# Patient Record
Sex: Female | Born: 1975
Health system: Southern US, Community
[De-identification: ages and names within clinical notes are randomized; demographics above are authoritative.]

## PROBLEM LIST (undated history)

## (undated) DIAGNOSIS — K2970 Gastritis, unspecified, without bleeding: Secondary | ICD-10-CM

## (undated) DIAGNOSIS — Z803 Family history of malignant neoplasm of breast: Secondary | ICD-10-CM

## (undated) HISTORY — PX: WISDOM TOOTH EXTRACTION: SHX21

## (undated) HISTORY — PX: KNEE SURGERY: SHX244

## (undated) HISTORY — DX: Family history of malignant neoplasm of breast: Z80.3

## (undated) HISTORY — PX: COLONOSCOPY: SHX174

---

## 1998-02-15 ENCOUNTER — Other Ambulatory Visit: Admission: RE | Admit: 1998-02-15 | Discharge: 1998-02-15 | Payer: Self-pay | Admitting: Obstetrics and Gynecology

## 1999-06-23 ENCOUNTER — Other Ambulatory Visit: Admission: RE | Admit: 1999-06-23 | Discharge: 1999-06-23 | Payer: Self-pay | Admitting: Obstetrics and Gynecology

## 2000-07-02 ENCOUNTER — Other Ambulatory Visit: Admission: RE | Admit: 2000-07-02 | Discharge: 2000-07-02 | Payer: Self-pay | Admitting: Obstetrics and Gynecology

## 2000-10-16 ENCOUNTER — Ambulatory Visit (HOSPITAL_COMMUNITY): Admission: RE | Admit: 2000-10-16 | Discharge: 2000-10-16 | Payer: Self-pay | Admitting: *Deleted

## 2000-10-16 ENCOUNTER — Encounter: Payer: Self-pay | Admitting: *Deleted

## 2000-11-09 ENCOUNTER — Inpatient Hospital Stay (HOSPITAL_COMMUNITY): Admission: AD | Admit: 2000-11-09 | Discharge: 2000-11-09 | Payer: Self-pay | Admitting: Obstetrics and Gynecology

## 2000-11-13 ENCOUNTER — Encounter: Payer: Self-pay | Admitting: *Deleted

## 2000-11-13 ENCOUNTER — Ambulatory Visit (HOSPITAL_COMMUNITY): Admission: RE | Admit: 2000-11-13 | Discharge: 2000-11-13 | Payer: Self-pay | Admitting: *Deleted

## 2000-12-04 ENCOUNTER — Encounter: Payer: Self-pay | Admitting: *Deleted

## 2000-12-04 ENCOUNTER — Ambulatory Visit (HOSPITAL_COMMUNITY): Admission: RE | Admit: 2000-12-04 | Discharge: 2000-12-04 | Payer: Self-pay | Admitting: *Deleted

## 2001-01-01 ENCOUNTER — Encounter: Payer: Self-pay | Admitting: *Deleted

## 2001-01-01 ENCOUNTER — Ambulatory Visit (HOSPITAL_COMMUNITY): Admission: RE | Admit: 2001-01-01 | Discharge: 2001-01-01 | Payer: Self-pay | Admitting: *Deleted

## 2001-02-22 ENCOUNTER — Inpatient Hospital Stay (HOSPITAL_COMMUNITY): Admission: AD | Admit: 2001-02-22 | Discharge: 2001-02-24 | Payer: Self-pay | Admitting: Obstetrics and Gynecology

## 2001-02-25 ENCOUNTER — Encounter: Admission: RE | Admit: 2001-02-25 | Discharge: 2001-03-27 | Payer: Self-pay | Admitting: Obstetrics and Gynecology

## 2002-07-23 ENCOUNTER — Other Ambulatory Visit: Admission: RE | Admit: 2002-07-23 | Discharge: 2002-07-23 | Payer: Self-pay | Admitting: Obstetrics and Gynecology

## 2002-11-20 ENCOUNTER — Other Ambulatory Visit: Admission: RE | Admit: 2002-11-20 | Discharge: 2002-11-20 | Payer: Self-pay | Admitting: Obstetrics and Gynecology

## 2003-03-01 ENCOUNTER — Inpatient Hospital Stay (HOSPITAL_COMMUNITY): Admission: AD | Admit: 2003-03-01 | Discharge: 2003-03-01 | Payer: Self-pay | Admitting: Obstetrics & Gynecology

## 2003-04-17 ENCOUNTER — Inpatient Hospital Stay (HOSPITAL_COMMUNITY): Admission: AD | Admit: 2003-04-17 | Discharge: 2003-04-17 | Payer: Self-pay | Admitting: Obstetrics & Gynecology

## 2003-05-02 ENCOUNTER — Inpatient Hospital Stay (HOSPITAL_COMMUNITY): Admission: AD | Admit: 2003-05-02 | Discharge: 2003-05-04 | Payer: Self-pay | Admitting: Obstetrics and Gynecology

## 2003-05-21 ENCOUNTER — Other Ambulatory Visit: Admission: RE | Admit: 2003-05-21 | Discharge: 2003-05-21 | Payer: Self-pay | Admitting: Obstetrics and Gynecology

## 2003-08-05 ENCOUNTER — Inpatient Hospital Stay (HOSPITAL_COMMUNITY): Admission: AD | Admit: 2003-08-05 | Discharge: 2003-08-07 | Payer: Self-pay | Admitting: Obstetrics and Gynecology

## 2003-08-08 ENCOUNTER — Encounter: Admission: RE | Admit: 2003-08-08 | Discharge: 2003-09-07 | Payer: Self-pay | Admitting: Obstetrics and Gynecology

## 2003-09-03 ENCOUNTER — Other Ambulatory Visit: Admission: RE | Admit: 2003-09-03 | Discharge: 2003-09-03 | Payer: Self-pay | Admitting: *Deleted

## 2003-09-08 ENCOUNTER — Encounter: Admission: RE | Admit: 2003-09-08 | Discharge: 2003-10-08 | Payer: Self-pay | Admitting: Obstetrics and Gynecology

## 2003-11-08 ENCOUNTER — Encounter: Admission: RE | Admit: 2003-11-08 | Discharge: 2003-12-08 | Payer: Self-pay | Admitting: Obstetrics and Gynecology

## 2004-01-08 ENCOUNTER — Encounter: Admission: RE | Admit: 2004-01-08 | Discharge: 2004-02-07 | Payer: Self-pay | Admitting: Obstetrics and Gynecology

## 2004-02-08 ENCOUNTER — Encounter: Admission: RE | Admit: 2004-02-08 | Discharge: 2004-03-09 | Payer: Self-pay | Admitting: Obstetrics and Gynecology

## 2004-04-09 ENCOUNTER — Encounter: Admission: RE | Admit: 2004-04-09 | Discharge: 2004-05-09 | Payer: Self-pay | Admitting: Obstetrics and Gynecology

## 2004-06-08 ENCOUNTER — Other Ambulatory Visit: Admission: RE | Admit: 2004-06-08 | Discharge: 2004-06-08 | Payer: Self-pay | Admitting: Obstetrics and Gynecology

## 2004-06-09 ENCOUNTER — Encounter: Admission: RE | Admit: 2004-06-09 | Discharge: 2004-06-27 | Payer: Self-pay | Admitting: Obstetrics and Gynecology

## 2005-01-14 ENCOUNTER — Inpatient Hospital Stay (HOSPITAL_COMMUNITY): Admission: AD | Admit: 2005-01-14 | Discharge: 2005-01-16 | Payer: Self-pay | Admitting: Obstetrics and Gynecology

## 2005-01-17 ENCOUNTER — Encounter: Admission: RE | Admit: 2005-01-17 | Discharge: 2005-02-16 | Payer: Self-pay | Admitting: Obstetrics and Gynecology

## 2005-02-17 ENCOUNTER — Encounter: Admission: RE | Admit: 2005-02-17 | Discharge: 2005-03-18 | Payer: Self-pay | Admitting: Obstetrics and Gynecology

## 2005-03-19 ENCOUNTER — Encounter: Admission: RE | Admit: 2005-03-19 | Discharge: 2005-04-18 | Payer: Self-pay | Admitting: Obstetrics and Gynecology

## 2005-04-19 ENCOUNTER — Encounter: Admission: RE | Admit: 2005-04-19 | Discharge: 2005-05-18 | Payer: Self-pay | Admitting: Obstetrics and Gynecology

## 2005-05-19 ENCOUNTER — Encounter: Admission: RE | Admit: 2005-05-19 | Discharge: 2005-06-18 | Payer: Self-pay | Admitting: Obstetrics and Gynecology

## 2005-06-19 ENCOUNTER — Encounter: Admission: RE | Admit: 2005-06-19 | Discharge: 2005-07-19 | Payer: Self-pay | Admitting: Obstetrics and Gynecology

## 2005-07-20 ENCOUNTER — Encounter: Admission: RE | Admit: 2005-07-20 | Discharge: 2005-08-19 | Payer: Self-pay | Admitting: Obstetrics and Gynecology

## 2005-08-20 ENCOUNTER — Encounter: Admission: RE | Admit: 2005-08-20 | Discharge: 2005-09-18 | Payer: Self-pay | Admitting: Obstetrics and Gynecology

## 2005-09-19 ENCOUNTER — Encounter: Admission: RE | Admit: 2005-09-19 | Discharge: 2005-10-19 | Payer: Self-pay | Admitting: Obstetrics and Gynecology

## 2005-10-20 ENCOUNTER — Encounter: Admission: RE | Admit: 2005-10-20 | Discharge: 2005-11-18 | Payer: Self-pay | Admitting: Obstetrics and Gynecology

## 2005-11-19 ENCOUNTER — Encounter: Admission: RE | Admit: 2005-11-19 | Discharge: 2005-12-14 | Payer: Self-pay | Admitting: Obstetrics and Gynecology

## 2007-03-03 ENCOUNTER — Inpatient Hospital Stay (HOSPITAL_COMMUNITY): Admission: AD | Admit: 2007-03-03 | Discharge: 2007-03-06 | Payer: Self-pay | Admitting: Obstetrics and Gynecology

## 2007-03-06 ENCOUNTER — Inpatient Hospital Stay (HOSPITAL_COMMUNITY): Admission: AD | Admit: 2007-03-06 | Discharge: 2007-03-07 | Payer: Self-pay | Admitting: Obstetrics & Gynecology

## 2007-04-07 ENCOUNTER — Inpatient Hospital Stay (HOSPITAL_COMMUNITY): Admission: AD | Admit: 2007-04-07 | Discharge: 2007-04-09 | Payer: Self-pay | Admitting: Obstetrics and Gynecology

## 2007-04-10 ENCOUNTER — Encounter: Admission: RE | Admit: 2007-04-10 | Discharge: 2007-05-09 | Payer: Self-pay | Admitting: Obstetrics and Gynecology

## 2007-05-10 ENCOUNTER — Encounter: Admission: RE | Admit: 2007-05-10 | Discharge: 2007-06-09 | Payer: Self-pay | Admitting: Obstetrics and Gynecology

## 2007-06-10 ENCOUNTER — Encounter: Admission: RE | Admit: 2007-06-10 | Discharge: 2007-07-10 | Payer: Self-pay | Admitting: Obstetrics and Gynecology

## 2007-07-11 ENCOUNTER — Encounter: Admission: RE | Admit: 2007-07-11 | Discharge: 2007-08-07 | Payer: Self-pay | Admitting: Obstetrics and Gynecology

## 2007-08-08 ENCOUNTER — Encounter: Admission: RE | Admit: 2007-08-08 | Discharge: 2007-09-07 | Payer: Self-pay | Admitting: Obstetrics and Gynecology

## 2007-09-08 ENCOUNTER — Encounter: Admission: RE | Admit: 2007-09-08 | Discharge: 2007-10-07 | Payer: Self-pay | Admitting: Obstetrics and Gynecology

## 2007-10-08 ENCOUNTER — Encounter: Admission: RE | Admit: 2007-10-08 | Discharge: 2007-11-07 | Payer: Self-pay | Admitting: Obstetrics and Gynecology

## 2007-11-08 ENCOUNTER — Encounter: Admission: RE | Admit: 2007-11-08 | Discharge: 2007-12-07 | Payer: Self-pay | Admitting: Obstetrics and Gynecology

## 2007-12-08 ENCOUNTER — Encounter: Admission: RE | Admit: 2007-12-08 | Discharge: 2008-01-07 | Payer: Self-pay | Admitting: Obstetrics and Gynecology

## 2008-01-08 ENCOUNTER — Encounter: Admission: RE | Admit: 2008-01-08 | Discharge: 2008-02-07 | Payer: Self-pay | Admitting: Obstetrics and Gynecology

## 2008-02-08 ENCOUNTER — Encounter: Admission: RE | Admit: 2008-02-08 | Discharge: 2008-03-08 | Payer: Self-pay | Admitting: Obstetrics and Gynecology

## 2008-03-09 ENCOUNTER — Encounter: Admission: RE | Admit: 2008-03-09 | Discharge: 2008-04-08 | Payer: Self-pay | Admitting: Obstetrics and Gynecology

## 2008-04-09 ENCOUNTER — Encounter: Admission: RE | Admit: 2008-04-09 | Discharge: 2008-05-08 | Payer: Self-pay | Admitting: Obstetrics and Gynecology

## 2008-05-09 ENCOUNTER — Encounter: Admission: RE | Admit: 2008-05-09 | Discharge: 2008-05-19 | Payer: Self-pay | Admitting: Obstetrics and Gynecology

## 2010-10-04 NOTE — Discharge Summary (Signed)
Nicole Dixon, Nicole Dixon           ACCOUNT NO.:  [id number]   MEDICAL RECORD NO.:  [id number]          PATIENT TYPE:  INP   LOCATION:  9117                          FACILITY:  [facility]   PHYSICIAN:  Gerrit Friends. Aldona Bar, M.D.   DATE OF BIRTH:  1975/09/09   DATE OF ADMISSION:  04/07/2007  DATE OF DISCHARGE:  04/09/2007                               DISCHARGE SUMMARY   DISCHARGE DIAGNOSES:  1. Term pregnancy, delivered 8 pounds 10 ounces female infant, Apgars 8      and 9.  2. Blood type O positive.   PROCEDURES:  1. Induction of labor.  2. Normal spontaneous delivery.   SUMMARY:  This 35 year old gravida 4, para 3 with a due date of November  28 was admitted at 33 to 44 weeks' gestation for induction because of  history of rapid labors after having an uncomplicated prenatal course.   After admission, her membranes were ruptured, and she progressed rapidly  to full dilatation and thereafter had delivery of a viable female infant  which weighed 8 pounds 10 ounces over an intact perineum.  Postpartum  course was benign.  Her discharge hemoglobin was 11.2 with a white count  of 10,000 and a platelet count of 162,000.  On the morning of November  18, she was ambulating well, tolerating a regular diet well, having  normal bowel and bladder function, was afebrile.  Her breast-feeding was  going well, and she desires to be discharged.   DISCHARGE MEDICATIONS:  Include vitamins - one a day as long as she is  breast-feeding, and she will use over-the-counter Motrin as needed for  discomfort.   FOLLOW-UP:  In the office will be arranged 4 weeks hence.   CONDITION ON DISCHARGE:  Improved.      Gerrit Friends. Aldona Bar, M.D.  Electronically Signed     RMW/MEDQ  D:  04/09/2007  T:  04/09/2007  Job:  [id number]

## 2010-10-04 NOTE — Discharge Summary (Signed)
NAMEKORTNEE, BAS           ACCOUNT NO.:  [id number]   MEDICAL RECORD NO.:  [id number]          PATIENT TYPE:  INP   LOCATION:  9153                          FACILITY:  [facility]   PHYSICIAN:  Ilda Mori, M.D.   DATE OF BIRTH:  1975-10-21   DATE OF ADMISSION:  03/03/2007  DATE OF DISCHARGE:  03/06/2007                               DISCHARGE SUMMARY   PREOPERATIVE DIAGNOSIS:  Preterm labor.   SECONDARY DIAGNOSIS:  Intrauterine pregnancy at [redacted] weeks gestation.   PROCEDURES:  1. Tocolysis.  2. Steroid protocol for fetal lung maturity.   COMPLICATIONS:  None.   CONDITION ON DISCHARGE:  Stable.   This is a 35 year old gravida 4, para 61 female who presented at 33.5  weeks with uterine contractions.  Cervix dilated 1 cm, and 70% effaced.  The patient had three previous full-term pregnancies.  The patient was  observed, and her contractions persisted, and she was placed on  magnesium sulfate for tocolysis, with good success.  She was also given  a dexamethasone protocol for fetal lung maturity, 6 mg q.12 h. x4.  The  patient's hospital course was uncomplicated.  On the evening of March 05, 2007, an ultrasound was done at the bedside which showed a cervical  length of 3 cm.  Fetal fibronectin was done and came back negative.  In  view of the negative fetal fibronectin and the patient's resolution of  contractions, it was felt reasonable to send her home on the morning of  the third hospital day.  She was sent home on a regular diet. Told to  limit her activities.  She was given Procardia 10 mg to take three times  a day, and to return the office in 1 week for follow-up evaluation.   LABORATORY DATA:  An ultrasound was done on March 03, 2007, which  showed an amniotic fluid index of 13.5, estimated fetal weight of 5  pounds 5 ounces, and normal anatomy.  The cervix length transabdominally  was 2.8 cm. Fetal fibronectin, as noted earlier, came back as negative.  Her admission  hemoglobin was 12.2, white count was 12,000.  Her Chem-7  was normal. LFTs were normal.  RPR was nonreactive.  She had a chest x-  ray because of chest tightness and shortness of breath, and this came  back normal.      Ilda Mori, M.D.  Electronically Signed     RK/MEDQ  D:  03/06/2007  T:  03/07/2007  Job:  [id number]

## 2011-02-28 LAB — CBC
HCT: 37.2
Hemoglobin: 11.2 — ABNORMAL LOW
Hemoglobin: 13
MCHC: 34.6
MCHC: 35
MCV: 88.7
MCV: 89.8
Platelets: 186
RBC: 3.61 — ABNORMAL LOW
RBC: 4.19
RDW: 13.9
RDW: 14
WBC: 9.5

## 2011-03-02 LAB — COMPREHENSIVE METABOLIC PANEL
ALT: 13
AST: 22
Albumin: 2.6 — ABNORMAL LOW
CO2: 23
Calcium: 8.2 — ABNORMAL LOW
Creatinine, Ser: 0.64
GFR calc Af Amer: >60
Sodium: 139

## 2011-03-02 LAB — CBC
MCHC: 35
MCV: 89.4
Platelets: 180
RBC: 3.91
WBC: 12 — ABNORMAL HIGH

## 2014-03-11 ENCOUNTER — Ambulatory Visit (INDEPENDENT_AMBULATORY_CARE_PROVIDER_SITE_OTHER): Payer: 59 | Admitting: Family Medicine

## 2014-03-11 ENCOUNTER — Other Ambulatory Visit (INDEPENDENT_AMBULATORY_CARE_PROVIDER_SITE_OTHER): Payer: 59

## 2014-03-11 VITALS — BP 106/72 | HR 75 | Ht 65.0 in | Wt 110.0 lb

## 2014-03-11 DIAGNOSIS — G5701 Lesion of sciatic nerve, right lower limb: Secondary | ICD-10-CM

## 2014-03-11 DIAGNOSIS — M25571 Pain in right ankle and joints of right foot: Secondary | ICD-10-CM

## 2014-03-11 DIAGNOSIS — M76899 Other specified enthesopathies of unspecified lower limb, excluding foot: Secondary | ICD-10-CM

## 2014-03-11 DIAGNOSIS — M76829 Posterior tibial tendinitis, unspecified leg: Secondary | ICD-10-CM

## 2014-03-11 NOTE — Patient Instructions (Signed)
Nice to see you Nicole Dixon 20 minutes 2 times daily. Usually after activity and before bed. Exercises 3 times a week. For feet and for hip alternate them.  Exercises on wall.  Heel and butt touching.  Raise leg 6 inches and hold 2 seconds.  Down slow for count of 4 seconds.  1 set of 30 reps daily on both sides.  On step heel lifts. 30 reps daily first week, then 2 sets daily second week then Everest daily 3 rd week.  Vitamin D 2000 IU daily.  [place] back right pocket with sitting.  Line drills when running focusing on keeping big toes straight. Come back in 3 weeks.

## 2014-03-11 NOTE — Progress Notes (Signed)
[name] [place], [place] [address] Phone: [phone] Subjective:    CC: Leg pain.   UUV:OZDGUYQIHK Nicole Dixon is a 38 y.o. female coming in with complaint of left leg pain and right hip pain. Patient also states that she is having bilateral ankle pain.  Patient is the most concerning is the left leg pain. Patient has seen another provider for this previously. Patient states that she has been diagnosed with iliotibial band syndrome multiple times. Patient is an avid runner and states when she is training for a marathon she starts having discomfort in the lateral aspect of her leg. Patient did have a popping sensation that can be very painful. Denies any radiation of pain or any numbness. Patient states that she is done formal physical therapy as well as exercises with minimal improvement. Patient is wondering why this continues to recur and if she can avoid this. Pain is 5/10. Denies any nighttime awakening.  Patient also states that she is having some mild right hip pain. States it hurts more when she sits for a long amount of time and then tries to run. Patient states it is more of a spasm and sensation. Denies any radiation down her leg and states that it seems localized. Patient has been diagnosed previously with a piriformis syndrome.   Bilateral ankle pains. Patient states it hurts on the medial aspect of the ankles. Very minimal pain, does not stop her from activity, worse with longer runs, no numbness, no radiation. Improves with icing and ibuprofen. 3/10 severity.       Past medical history, social, surgical and family history all reviewed in electronic medical record.   Review of Systems: No headache, visual changes, nausea, vomiting, diarrhea, constipation, dizziness, abdominal pain, skin rash, fevers, chills, night sweats, weight loss, swollen lymph nodes, body aches, joint swelling, muscle aches, chest pain, shortness of  breath, mood changes.   Objective Blood pressure 106/72, pulse 75, height 5\' 5"  (1.651 m), weight 110 lb (49.896 kg), SpO2 99.00%.  General: No apparent distress alert and oriented x3 mood and affect normal, dressed appropriately.  HEENT: Pupils equal, extraocular movements intact  Respiratory: Patient's speak in full sentences and does not appear short of breath  Cardiovascular: No lower extremity edema, non tender, no erythema  Skin: Warm dry intact with no signs of infection or rash on extremities or on axial skeleton.  Abdomen: Soft nontender  Neuro: Cranial nerves II through XII are intact, neurovascularly intact in all extremities with 2+ DTRs and 2+ pulses.  Lymph: No lymphadenopathy of posterior or anterior cervical chain or axillae bilaterally.  Gait normal with good balance and coordination.  Patient's running gait though shows the patient's knees to cross midline significantly and does have overpronation of the hindfoot. Patient is a heelstriker MSK:  Non tender with full range of motion and good stability and symmetric strength and tone of shoulders, elbows, wrist,  Knees bilaterally.  Hip: Right ROM IR: 25 Deg, ER: 45 Deg, Flexion: 120 Deg, Extension: 100 Deg, Abduction: 45 Deg, Adduction: 25 Deg Strength IR: 5/5, ER: 5/5, Flexion: 5/5, Extension: 5/5, Abduction: 3+/5, Adduction: 5/5 Pelvic alignment unremarkable to inspection and palpation. Standing hip rotation and gait without trendelenburg sign / unsteadiness. Greater trochanter without tenderness to palpation. Moderate tenderness on the right piriformis. + FABER TTP over distal ITB bilateral No SI joint tenderness and normal minimal SI movement. Ankle: bilateral No visible erythema or swelling. Range of motion is  full in all directions. Over-pronation hindfoot bilaterally. Strength is 5/5 in all directions. Stable lateral and medial ligaments; squeeze test and kleiger test unremarkable; Talar dome nontender; No pain  at base of 5th MT; No tenderness over cuboid; No tenderness over N spot or navicular prominence No tenderness on posterior aspects of lateral and medial malleolus mild pain over posterior tib.  No sign of peroneal tendon subluxations or tenderness to palpation Negative tarsal tunnel tinel's Able to walk 4 steps.   MSK US performed of: Bilateral This study was ordered, performed, and interpreted by Charlann Boxer D.O.  Foot/Ankle:   All structures visualized.   Talar dome unremarkable  Ankle mortise without effusion. Peroneus longus and brevis tendons unremarkable on long and transverse views without sheath effusions. Posterior tibialis has mild hypoechoic changes but no true tear appreciated, flexor hallucis longus, and flexor digitorum longus tendons unremarkable on long and transverse views without sheath effusions. Achilles tendon visualized along length of tendon and unremarkable on long and transverse views without sheath effusion. Anterior Talofibular Ligament and Calcaneofibular Ligaments unremarkable and intact. Deltoid Ligament unremarkable and intact. Plantar fascia intact and without effusion, normal thickness. No increased doppler signal, cap sign, or thickening of tibial cortex. Power doppler signal normal.  IMPRESSION:  Mild bilateral posterior tibialis    Impression and Recommendations:     This case required medical decision making of moderate complexity.

## 2014-03-12 NOTE — Assessment & Plan Note (Signed)
Patient's pain with running as well as her distal iliotibial band syndrome secondary to the hip abductor weakness. We discussed some exercises for strengthening and which activities to avoid any interim. Patient and will come back and see [place] again in 3 weeks to make sure that she improves.

## 2014-03-12 NOTE — Assessment & Plan Note (Signed)
Piriformis Syndrome  Using an anatomical model, reviewed with the patient the structures involved and how they related to diagnosis. The patient indicated understanding.   The patient was given a handout from Dr. Rouzier's book "The Sports Medicine Patient Advisor" describing the anatomy and rehabilitation of the following condition: Piriformis Syndrome  Also given a handout with more extensive Piriformis stretching, hip flexor and abductor strengthening, ham stretching  Rec deep massage, explained self-massage with ball RTC in 3 weeks.  

## 2014-03-12 NOTE — Assessment & Plan Note (Signed)
The patient has mild posterior tibialis tendinitis secondary to her overpronation. We discussed home exercises, proper shoe wear, over-the-counter orthotics, and icing protocol. Patient will try this as well as a trial of topical anti-inflammatories and come back and see me again

## 2014-04-01 ENCOUNTER — Ambulatory Visit: Payer: 59 | Admitting: Family Medicine

## 2014-04-08 ENCOUNTER — Encounter: Payer: Self-pay | Admitting: Family Medicine

## 2014-04-08 ENCOUNTER — Ambulatory Visit (INDEPENDENT_AMBULATORY_CARE_PROVIDER_SITE_OTHER): Payer: 59 | Admitting: Family Medicine

## 2014-04-08 VITALS — BP 122/80 | HR 75 | Ht 65.0 in | Wt 110.0 lb

## 2014-04-08 DIAGNOSIS — M76829 Posterior tibial tendinitis, unspecified leg: Secondary | ICD-10-CM

## 2014-04-08 DIAGNOSIS — M76899 Other specified enthesopathies of unspecified lower limb, excluding foot: Secondary | ICD-10-CM

## 2014-04-08 NOTE — Assessment & Plan Note (Signed)
Patient is improving significantly. Discussed with patient to continue the home exercises fairly regularly. Patient will continue with the line drills with running. We discussed other strengthening exercises including Achilles strengthening as well as strengthening that will be beneficial. We discussed the possibility of compression sleeve with running. Patient is to follow-up with me again in 4 weeks. Patient will start on the exercise prescription as stated in the patient instructions.

## 2014-04-08 NOTE — Assessment & Plan Note (Signed)
Continue with the strengthening exercises. Patient is going to start with more of a weight training aspect of it as well to try to do more strengthening. We discussed if any worsening pain and come back sooner but otherwise will follow-up in 4 weeks.

## 2014-04-08 NOTE — Patient Instructions (Signed)
Good to see you I am impressed.  On wall exercise add 2# ankle weights.  Start running 5:1 minutes running to walking 3 times a week. 1.5 miles top.  Then option is to either increase by 1/2 mile a week up to 3 miles or increase amount of running per 1 minute walking.  See me again in 4 weeks.

## 2014-04-08 NOTE — Progress Notes (Signed)
[name] [address] [place], [place] [address] Phone: [phone] Subjective:    CC: Leg pain.   WNU:UVOZDGUYQI Nicole Dixon is a 38 y.o. female coming in with complaint of left leg pain and right hip pain. Patient also states that she is having bilateral ankle pain. Patient was seen previously and did have posterior tibialis tendinitis. Patient was given exercises and states that she is approximately 80% better. Patient has started running slowly and has started with some of the line drills. Patient states that she has noticed significant improvement. Patient is even improved her time with running.  Patient is working on the hip abductor weakness that was noted as well. Patient has noticed that this has made the biggest differences. Patient is able to go up and down stairs much more efficiently she states. Denies the radiation to the lateral aspect of the knees. Patient is feeling better overall.     Past medical history, social, surgical and family history all reviewed in electronic medical record.   Review of Systems: No headache, visual changes, nausea, vomiting, diarrhea, constipation, dizziness, abdominal pain, skin rash, fevers, chills, night sweats, weight loss, swollen lymph nodes, body aches, joint swelling, muscle aches, chest pain, shortness of breath, mood changes.   Objective Blood pressure 122/80, pulse 75, height 5\' 5"  (1.651 m), weight 110 lb (49.896 kg), SpO2 99 %.  General: No apparent distress alert and oriented x3 mood and affect normal, dressed appropriately.  HEENT: Pupils equal, extraocular movements intact  Respiratory: Patient's speak in full sentences and does not appear short of breath  Cardiovascular: No lower extremity edema, non tender, no erythema  Skin: Warm dry intact with no signs of infection or rash on extremities or on axial skeleton.  Abdomen: Soft nontender  Neuro: Cranial nerves II through XII are intact,  neurovascularly intact in all extremities with 2+ DTRs and 2+ pulses.  Lymph: No lymphadenopathy of posterior or anterior cervical chain or axillae bilaterally.  Gait normal with good balance and coordination.  Patient's running gait though shows the patient's knees to cross midline significantly and does have overpronation of the hindfoot. Patient is a heelstriker MSK:  Non tender with full range of motion and good stability and symmetric strength and tone of shoulders, elbows, wrist,  Knees bilaterally.  Hip: Right ROM IR: 25 Deg, ER: 45 Deg, Flexion: 120 Deg, Extension: 100 Deg, Abduction: 45 Deg, Adduction: 25 Deg Strength IR: 5/5, ER: 5/5, Flexion: 5/5, Extension: 5/5, Abduction: 3+/5, Adduction: 5/5 Pelvic alignment unremarkable to inspection and palpation. Standing hip rotation and gait without trendelenburg sign / unsteadiness. Greater trochanter without tenderness to palpation. Improve tenderness over the right piriformis + FABERbut improved range of motion nontender over the distal IT band bilaterally this is different from previous exam No SI joint tenderness and normal minimal SI movement. Ankle: bilateral No visible erythema or swelling. Range of motion is full in all directions. Over-pronation hindfoot bilaterally. Strength is 5/5 in all directions. Stable lateral and medial ligaments; squeeze test and kleiger test unremarkable; Talar dome nontender; No pain at base of 5th MT; No tenderness over cuboid; No tenderness over N spot or navicular prominence No tenderness on posterior aspects of lateral and medial malleolus mild pain over posterior tib.  No sign of peroneal tendon subluxations or tenderness to palpation Negative tarsal tunnel tinel's Able to walk 4 steps.  no significant change of the ankles from previous exam.     Impression and Recommendations:  This case required medical decision making of moderate complexity.

## 2014-05-06 ENCOUNTER — Encounter: Payer: Self-pay | Admitting: Family Medicine

## 2014-05-06 ENCOUNTER — Ambulatory Visit (INDEPENDENT_AMBULATORY_CARE_PROVIDER_SITE_OTHER): Payer: 59 | Admitting: Family Medicine

## 2014-05-06 VITALS — BP 110/60 | HR 81 | Ht 65.0 in | Wt 113.0 lb

## 2014-05-06 DIAGNOSIS — M7052 Other bursitis of knee, left knee: Secondary | ICD-10-CM | POA: Insufficient documentation

## 2014-05-06 NOTE — Progress Notes (Signed)
[facility] [place], [place] [address] Phone: [phone] Subjective:    CC: Leg pain.   GMW:NUUVOZDGUY Nicole Dixon is a 38 y.o. female coming in with complaint of left leg pain.   Patient also states that she is having bilateral ankle pain. Patient was seen previously and did have posterior tibialis tendinitis. 99% better no pain stopping her from activity.   Patient is working on the hip abductor weakness that was noted as well. Patient was having pain now more at the lower insertion of her iliotibial band. Patient states that it has been sore previously and has noticed that this is more after running. Patient states that she runs downhill it seems to be significantly more painful. Patient states though not painful enough to stop her from any activity. States that the lateral aspect of her hip is feeling significant better though.     Past medical history, social, surgical and family history all reviewed in electronic medical record.   Review of Systems: No headache, visual changes, nausea, vomiting, diarrhea, constipation, dizziness, abdominal pain, skin rash, fevers, chills, night sweats, weight loss, swollen lymph nodes, body aches, joint swelling, muscle aches, chest pain, shortness of breath, mood changes.   Objective Blood pressure 110/60, pulse 81, height 5\' 5"  (1.651 m), weight 113 lb (51.256 kg), SpO2 97 %.  General: No apparent distress alert and oriented x3 mood and affect normal, dressed appropriately.  HEENT: Pupils equal, extraocular movements intact  Respiratory: Patient's speak in full sentences and does not appear short of breath  Cardiovascular: No lower extremity edema, non tender, no erythema  Skin: Warm dry intact with no signs of infection or rash on extremities or on axial skeleton.  Abdomen: Soft nontender  Neuro: Cranial nerves II through XII are intact, neurovascularly intact in all extremities with 2+ DTRs  and 2+ pulses.  Lymph: No lymphadenopathy of posterior or anterior cervical chain or axillae bilaterally.  Gait normal with good balance and coordination.  Patient's running gait though shows the patient's knees to cross midline significantly and does have overpronation of the hindfoot. Patient is a heelstriker MSK:  Non tender with full range of motion and good stability and symmetric strength and tone of shoulders, elbows, wrist,  bilaterally.  Hip: Right ROM IR: 25 Deg, ER: 45 Deg, Flexion: 120 Deg, Extension: 100 Deg, Abduction: 45 Deg, Adduction: 25 Deg Strength IR: 5/5, ER: 5/5, Flexion: 5/5, Extension: 5/5, Abduction: 3+/5, Adduction: 5/5 Pelvic alignment unremarkable to inspection and palpation. Standing hip rotation and gait without trendelenburg sign / unsteadiness. Greater trochanter without tenderness to palpation. Improve tenderness over the right piriformis Negative Faber nontender over the distal IT band bilaterally this is different from previous exam No SI joint tenderness and normal minimal SI movement.  Knee:left Normal to inspection with no erythema or effusion or obvious bony abnormalities. Patient is tender to palpation over the lateral joint line. ROM full in flexion and extension and lower leg rotation. Ligaments with solid consistent endpoints including ACL, PCL, LCL, MCL. Negative Mcmurray's, Apley's, and Thessalonian tests. Non painful patellar compression. Patellar glide without crepitus. Patellar and quadriceps tendons unremarkable. Hamstring and quadriceps strength is normal.   MSK US performed of: left knee This study was ordered, performed, and interpreted by Charlann Boxer D.O.  Knee: All structures visualized. Anteromedial, anterolateral, posteromedial, and posterolateral menisci unremarkable without tearing, fraying, effusion, or displacement. Popliteus tendon does have hypoechoic changes but no true tear patient. Patellar Tendon unremarkable  on long  and transverse views without effusion. No abnormality of prepatellar bursa. LCL and MCL unremarkable on long and transverse views. No abnormality of origin of medial or lateral head of the gastrocnemius.  IMPRESSION:  Popliteus tendinitis     Impression and Recommendations:     This case required medical decision making of moderate complexity.

## 2014-05-06 NOTE — Patient Instructions (Signed)
Good to see you [email or web address] size small knee sleeve.  Ice after activity.  Try the pennsaid up to 2 times daily  New exercises after running You are getting so strong!!! Protein!!!! 4:1 ratio of carbs to protein within 30 minutes of exercise.  See me in 4 weeks.  Popliteus Tendinitis with Rehab Tendonitis is a condition that is characterized by inflammation of a tendon. A tendon is the soft tissue that connects muscles to the skeletal system allowing for body movements. Popliteus tendonitis affects the popliteus tendon, which connects the popliteus muscle to the thigh bone (femur) near the knee. The popliteus muscle helps bend and rotate the knee. Popliteus tendonitis is often caused by a tendon tear (strain). Strains are classified into three categories. Grade 1 strains cause pain, but the tendon is not lengthened. Grade 2 strains include a lengthened ligament due to the ligament being stretched or partially ruptured. With grade 2 strains there is still function, although the function may be diminished. Grade 3 strains are characterized by a complete tear of the tendon or muscle, and function is usually impaired. SYMPTOMS   Pain in the knee, specifically the outer (lateral) and back (posterior) portions.  Pain that worsens with use of the popliteus muscle (standing on a slightly bent knee or rotating the knee).  A crackling sound (crepitation) when the tendon is moved or touched (uncommon, except when tested just after exercising). CAUSES Popliteus tendonitis occurs when damage to the popliteus tendon elicits an inflammatory (healing) response. Popliteus tendonitis is often an overuse injury. RISK INCREASES WITH:  Activities that require extensive running or walking downhill.  Poor strength and flexibility.  Failure to warm-up properly before activity.  Flat feet. PREVENTION  Warm up and stretch properly before activity.  Allow for adequate recovery between workouts.  Maintain  physical fitness:  Strength, flexibility, and endurance.  Cardiovascular fitness.  Learn and implement proper training regimens and sports technique.  Arch supports (orthotics) for individuals with flat feet. PROGNOSIS  If treated properly, then the symptoms of popliteus tendonitis usually resolve within 6 weeks.  RELATED COMPLICATIONS   Prolonged healing time, if improperly treated or re-injured.  Recurrent symptoms that result in a chronic problem. TREATMENT  Treatment initially involves the use of ice and medication to help reduce pain and inflammation. The use of strengthening and stretching exercises may help reduce pain with activity. These exercises may be performed at home or with referral to a therapist. Many individuals find that the use of a compression bandage or a knee sleeve helps reduce symptoms. If you have flat feet, then your caregiver may recommend arch supports. It is important to learn/modify techniques for running uphill/downhill that do not aggravate your symptoms. If symptoms persist for greater than 6 months despite conservative (non-surgical) treatment, then surgery may be recommended to remove the tendon sheath (lining).  MEDICATION   If pain medication is necessary, then nonsteroidal anti-inflammatory medications, such as aspirin and ibuprofen, or other minor pain relievers, such as acetaminophen, are often recommended.  Do not take pain medication within 7 days before surgery.  Prescription pain relievers may be given if deemed necessary by your caregiver. Use only as directed and only as much as you need. HEAT AND COLD  Cold treatment (icing) relieves pain and reduces inflammation. Cold treatment should be applied for 10 to 15 minutes every 2 to 3 hours for inflammation and pain and immediately after any activity that aggravates your symptoms. Use ice packs or massage the area with a  piece of ice (ice massage).  Heat treatment may be used prior to performing  the stretching and strengthening activities prescribed by your caregiver, physical therapist, or athletic trainer. Use a heat pack or soak the injury in warm water. SEEK MEDICAL CARE IF:  Treatment seems to offer no benefit, or the condition worsens.  Any medications produce adverse side effects. EXERCISES RANGE OF MOTION (ROM) AND STRETCHING EXERCISES - Popliteus Tendinitis These exercises may help you when beginning to rehabilitate your injury. Your symptoms may resolve with or without further involvement from your physician, physical therapist or athletic trainer. While completing these exercises, remember:   Restoring tissue flexibility helps normal motion to return to the joints. This allows healthier, less painful movement and activity.  An effective stretch should be held for at least 30 seconds.  A stretch should never be painful. You should only feel a gentle lengthening or release in the stretched tissue. STRETCH - Gastroc, Standing   Place hands on wall.  Extend right / left leg, keeping the front knee somewhat bent.  Slightly point your toes inward on your back foot.  Keeping your right / left heel on the floor and your knee straight, shift your weight toward the wall, not allowing your back to arch.  You should feel a gentle stretch in the right / left calf. Hold this position for __________ seconds. Repeat __________ times. Complete this stretch __________ times per day. STRETCH - Soleus, Standing   Place hands on wall.  Extend right / left leg, keeping the other knee somewhat bent.  Slightly point your toes inward on your back foot.  Keep your right / left heel on the floor, bend your back knee, and slightly shift your weight over the back leg so that you feel a gentle stretch deep in your back calf.  Hold this position for __________ seconds. Repeat __________ times. Complete this stretch __________ times per day. STRETCH - Gastrocsoleus, Standing  Note: This  exercise can place a lot of stress on your foot and ankle. Please complete this exercise only if specifically instructed by your caregiver.   Place the ball of your right / left foot on a step, keeping your other foot firmly on the same step.  Hold on to the wall or a rail for balance.  Slowly lift your other foot, allowing your body weight to press your heel down over the edge of the step.  You should feel a stretch in your right / left calf.  Hold this position for __________ seconds.  Repeat this exercise with a slight bend in your right / left knee. Repeat __________ times. Complete this stretch __________ times per day.  STRETCH - Hamstrings, Standing  Stand or sit and extend your right / left leg, placing your foot on a chair or foot stool  Keeping a slight arch in your low back and your hips straight forward.  Lead with your chest and lean forward at the waist until you feel a gentle stretch in the back of your right / left knee or thigh. (When done correctly, this exercise requires leaning only a small distance.)  Hold this position for __________ seconds. Repeat __________ times. Complete this stretch __________ times per day. STRETCH - Hamstrings, Supine   Lie on your back. Loop a belt or towel over the ball of your right / left foot.  Straighten your right / left knee and slowly pull on the belt to raise your leg. Do not allow the right /  left knee to bend. Keep your opposite leg flat on the floor.  Raise the leg until you feel a gentle stretch behind your right / left knee or thigh. Hold this position for __________ seconds. Repeat __________ times. Complete this stretch __________ times per day.  STRETCH - Hamstrings, Doorway  Lie on your back with your right / left leg extended and resting on the wall and the opposite leg flat on the ground through the door. Initially, position your bottom farther away from the wall than the illustration shows.  Keep your right /  left knee straight. If you feel a stretch behind your knee or thigh, hold this position for __________ seconds.  If you do not feel a stretch, scoot your bottom closer to the door, and hold __________ seconds. Repeat __________ times. Complete this stretch __________ times per day.  STRETCH - Quadriceps, Prone   Lie on your stomach on a firm surface, such as a bed or padded floor.  Bend your right / left knee and grasp your ankle. If you are unable to reach, your ankle or pant leg, use a belt around your foot to lengthen your reach.  Gently pull your heel toward your buttocks. Your knee should not slide out to the side. You should feel a stretch in the front of your thigh and/or knee.  Hold this position for __________ seconds. Repeat __________ times. Complete this stretch __________ times per day.  STRENGTHENING EXERCISES - Popliteus Tendinitis These exercises may help you when beginning to rehabilitate your injury. They may resolve your symptoms with or without further involvement from your physician, physical therapist or athletic trainer. While completing these exercises, remember:   Muscles can gain both the endurance and the strength needed for everyday activities through controlled exercises.  Complete these exercises as instructed by your physician, physical therapist or athletic trainer. Progress the resistance and repetitions only as guided. STRENGTH - Hamstring, Isometrics   Lie on your back on a firm surface.  Bend your right / left knee approximately __________ degrees.  Dig your heel into the surface as if you are trying to pull it toward your buttocks. Tighten the muscles in the back of your thighs to "dig" as hard as you can without increasing any pain.  Hold this position for __________ seconds.  Release the tension gradually and allow your muscle to completely relax for __________ seconds in between each exercise. Repeat __________ times. Complete this exercise  __________ times per day.  STRENGTH - Hamstring, Curls   Lay on your stomach with your legs extended. (If you lay on a bed, your feet may hang over the edge.)  Tighten the muscles in the back of your thigh to bend your right / left knee up to 90 degrees. Keep your hips flat on the bed/floor.  Hold this position for __________ seconds.  Slowly lower your leg back to the starting position. Repeat __________ times. Complete this exercise __________ times per day.  OPTIONAL ANKLE WEIGHTS: Begin with ____________________, but DO NOT exceed ____________________. Increase in1 lb/0.5 kg increments. Document Released: 05/08/2005 Document Revised: 07/31/2011 Document Reviewed: 08/20/2008 [facility] Patient Information 2015 [place], [place]. This information is not intended to replace advice given to you by your health care provider. Make sure you discuss any questions you have with your health care provider.

## 2014-05-06 NOTE — Assessment & Plan Note (Signed)
Compression, sleeve, icing, HEP, and discussed topical medicines, rx given.  Patient will come back and see me again in 3 weeks.  Spent greater than 25 minutes with patient face-to-face and had greater than 50% of counseling including as described above in assessment and plan.

## 2014-06-04 ENCOUNTER — Ambulatory Visit: Payer: 59 | Admitting: Family Medicine

## 2014-06-19 ENCOUNTER — Ambulatory Visit: Payer: Self-pay | Admitting: Family Medicine

## 2014-07-07 ENCOUNTER — Ambulatory Visit: Payer: Self-pay | Admitting: Family Medicine

## 2014-10-27 ENCOUNTER — Other Ambulatory Visit: Payer: Self-pay | Admitting: Obstetrics and Gynecology

## 2014-10-27 DIAGNOSIS — Z1231 Encounter for screening mammogram for malignant neoplasm of breast: Secondary | ICD-10-CM

## 2014-10-27 DIAGNOSIS — Z803 Family history of malignant neoplasm of breast: Secondary | ICD-10-CM

## 2014-10-28 ENCOUNTER — Ambulatory Visit
Admission: RE | Admit: 2014-10-28 | Discharge: 2014-10-28 | Disposition: A | Discharge status: Home / Self Care | Payer: 59 | Source: Ambulatory Visit | Attending: Obstetrics and Gynecology | Admitting: Obstetrics and Gynecology

## 2014-10-28 ENCOUNTER — Encounter (INDEPENDENT_AMBULATORY_CARE_PROVIDER_SITE_OTHER): Payer: Self-pay

## 2014-10-28 DIAGNOSIS — Z1231 Encounter for screening mammogram for malignant neoplasm of breast: Secondary | ICD-10-CM

## 2014-10-28 DIAGNOSIS — Z803 Family history of malignant neoplasm of breast: Secondary | ICD-10-CM

## 2014-10-29 ENCOUNTER — Ambulatory Visit (HOSPITAL_BASED_OUTPATIENT_CLINIC_OR_DEPARTMENT_OTHER): Payer: 59 | Admitting: Genetic Counselor

## 2014-10-29 ENCOUNTER — Encounter: Payer: Self-pay | Admitting: Genetic Counselor

## 2014-10-29 ENCOUNTER — Other Ambulatory Visit: Payer: 59

## 2014-10-29 DIAGNOSIS — Z315 Encounter for genetic counseling: Secondary | ICD-10-CM

## 2014-10-29 DIAGNOSIS — Z803 Family history of malignant neoplasm of breast: Secondary | ICD-10-CM | POA: Diagnosis not present

## 2014-10-29 NOTE — Progress Notes (Signed)
REFERRING PROVIDER: Freda Munro, MD  PRIMARY PROVIDER:  No primary care provider on file.  PRIMARY REASON FOR VISIT:  1. Family history of breast cancer      HISTORY OF PRESENT ILLNESS:   Nicole Dixon, a 39 y.o. female, was seen for a [place] cancer genetics consultation at the request of Dr. Ouida Dixon due to a family history of cancer.  Nicole Dixon presents to clinic today to discuss the possibility of a hereditary predisposition to cancer, genetic testing, and to further clarify her future cancer risks, as well as potential cancer risks for family members. Nicole Dixon is a 39 y.o. female with no personal history of cancer.  Her twin sister was recently diagnosed with breast cancer and was found to carry a PALB2 mutation.  Nicole Dixon mother was tested for the PALB2 mutation and was found to be negative.  Additional genetic testing is pending on her mother. Based on the genetic testing in Nicole Dixon family, it appears that the mutation may be coming from her father's side of the family.  CANCER HISTORY:   No history exists.     HORMONAL RISK FACTORS:  Menarche was at age 1.  First live birth at age 72.  OCP use for approximately <1 years.  Ovaries intact: yes.  Hysterectomy: no.  Menopausal status: premenopausal.  HRT use: 0 years. Colonoscopy: no; not examined. Mammogram within the last year: yes. Number of breast biopsies: 0. Up to date with pelvic exams:  yes. Any excessive radiation exposure in the past:  no  Past Medical History  Diagnosis Date  . Family history of breast cancer     History reviewed. No pertinent past surgical history.  History   Social History  . Marital Status: Married    Spouse Name: N/A  . Number of Children: 4  . Years of Education: N/A   Social History Main Topics  . Smoking status: Never Smoker   . Smokeless tobacco: Not on file  . Alcohol Use: 0.0 oz/week    0 Standard drinks or equivalent per week  . Drug Use:  Not on file  . Sexual Activity: Not on file   Other Topics Concern  . None   Social History Narrative     FAMILY HISTORY:  We obtained a detailed, 4-generation family history.  Significant diagnoses are listed below: Family History  Problem Relation Age of Onset  . Breast cancer Sister 40    PALB2+  . Breast cancer Maternal Aunt 64  . Bladder Cancer Maternal Aunt 64  . Dementia Maternal Grandmother   . Breast cancer Paternal Grandmother     dx in her 36s  . Breast cancer Cousin     dx in her late 23s; BRCA-  . Cancer Cousin     leiomyosarcoma  . Breast cancer Other     MGMs sister  . Breast cancer Other     MGMs sister  . Cancer Other     PGFs twin brother with Cancer NOS   The patient's sister was recently diagnosed with breast cancer and has a PALB2 mutation.  She has three boys who are healthy and a daughter who passed away at age 56 from a seizure.  Nicole Dixon mother was found to be negative for the [facility] in [place].  Her mother has two brothers and a sister who had breast cancer at 81 and passed away.  This sister has three daughters, one who had breast cancer in her late 53s and is  reportedly BRCA negative.  There is another cousin who was diagnosed with a leiomyosarcoma in her 89s, and ultimately succumbed to leukemia.  Nicole Dixon maternal grandparents are deceased.  Her grandmother had two sisters with breast cancer and one of those sisters had a daughter with breast cancer.  Nicole Dixon father has one sister who is healthy.  His mother had breast cancer in her 32s, and his father's twin brother had a Cancer NOS. Patient's maternal ancestors are of Caucasian descent, and paternal ancestors are of Caucasian descent. There is no reported Ashkenazi Jewish ancestry. There is no known consanguinity.  GENETIC COUNSELING ASSESSMENT: Nicole Dixon is a 39 y.o. 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 We, therefore, discussed and recommended the following at today's visit.   DISCUSSION: We reviewed the characteristics, features and inheritance patterns of hereditary cancer syndromes. We discussed the risks for PALB2 is 50% based on her sister's diagnosis.  We also reviewed the slim possibility that her sister could be the first person in the family with a PALB2 mutation.  We reviewed PALB2 cancer risks for breast cancer and management options based on a positive test.  Nicole Dixon mother is having a larger panel test added to her original targeted PALB2 testing to confirm that there is not a separate hereditary cancer syndrome running in the family based on her family history.  We also discussed genetic testing, including the appropriate family members to test, the process of testing, insurance coverage and turn-around-time for results. We discussed the implications of a negative, positive and/or variant of uncertain significant result. We recommended Nicole Dixon pursue genetic testing for the PALB2 gene, and will put additional testing on hold until her mother's genetic testing is completed.   Based on Nicole Dixon family history of cancer, she meets medical criteria for genetic testing. Despite that she meets criteria, she may still have an out of pocket cost. We discussed that if her out of pocket cost for testing is over $100, the laboratory will call and confirm whether she wants to proceed with testing.  If the out of pocket cost of testing is less than $100 she will be billed by the genetic testing laboratory.   PLAN: After considering the risks, benefits, and limitations, Nicole Dixon  provided informed consent to pursue genetic testing and the blood sample was sent to [facility] for analysis of the PALB2 gene, and then putting her sample on hold waiting for her mother's result. Results should be available within approximately 7-10 days time,  at which point they will be disclosed by telephone to Nicole Dixon, as will any additional recommendations warranted by these results. Nicole Dixon will receive a summary of her genetic counseling visit and a copy of her results once available. This information will also be available in Epic. We encouraged Nicole Dixon to remain in contact with cancer genetics annually so that we can continuously update the family history and inform her of any changes in cancer genetics and testing that may be of benefit for her family. Nicole Dixon. Klugh's questions were answered to her satisfaction today. Our contact information was provided should additional questions or concerns arise.  Lastly, we encouraged Nicole Dixon. Darsey to remain in contact with cancer genetics annually so that we can continuously update the family history and inform her of any changes in cancer genetics and testing that may be of benefit for  this family.   Nicole Dixon.  Alverio's questions were answered to her satisfaction today. Our contact information was provided should additional questions or concerns arise. Thank you for the referral and allowing [place] to share in the care of your patient.   Shaguana Love P. Florene Glen, [place], [facility] Certified Genetic Counselor [email or web address] phone: [phone]  The patient was seen for a total of 30 minutes in face-to-face genetic counseling.  This patient was discussed with Drs. Magrinat, Lindi Adie and/or Burr Medico who agrees with the above.    _______________________________________________________________________ For Office Staff:  Number of people involved in session: 1 Was an Intern/ student involved with case: no

## 2014-11-19 ENCOUNTER — Telehealth: Payer: Self-pay | Admitting: Genetic Counselor

## 2014-11-19 NOTE — Telephone Encounter (Signed)
Informed Ms. Chandran that her mother's genetic test results were negative, so we will go ahead and proceed with single-site genetic testing as planned.  Results should be available in about 2 weeks, and either myself or Santiago Glad will call her with those.

## 2014-11-27 ENCOUNTER — Encounter: Payer: Self-pay | Admitting: Genetic Counselor

## 2014-11-27 ENCOUNTER — Telehealth: Payer: Self-pay | Admitting: Genetic Counselor

## 2014-11-27 DIAGNOSIS — Z1379 Encounter for other screening for genetic and chromosomal anomalies: Secondary | ICD-10-CM

## 2014-11-27 DIAGNOSIS — Z803 Family history of malignant neoplasm of breast: Secondary | ICD-10-CM

## 2014-11-27 NOTE — Progress Notes (Signed)
GENETIC TEST RESULTS  HPI: Ms. 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  This PALB2 mutation was initially discovered in Ms. Winker's fraternal twin sister.  Based on the predominant maternal family history of breast cancer, Ms. Tinner's mother was tested for this KFM, and subsequently tested negative.  Ms. Bessinger's father will be tested for this mutation in the near future.  Still trying to explain the maternal family history of breast cancer, Ms. Veno's mother's genetic testing was reflexed to a panel of genes related to increased risks for breast and ovarian cancer.  This result was also negative.  With no additional identified mutations, we proceeded with testing for Ms. Segreto just for the [facility] in [place].  Please refer to our prior cancer genetics clinic note from October 29, 2014 for more information regarding Ms. Jay's medical, social and family histories, and our assessment and recommendations, at the time. Ms. Zavada recent genetic test results were disclosed to her, as were recommendations warranted by these results. These results and recommendations are discussed in more detail below.  GENETIC TEST RESULTS: At the time of Ms. Bressman's visit, we recommended she pursue genetic testing of the targeted variant testing for the [facility], "c.1317delG" in the PALB2 gene.  This testing was performed by [facility] Hope Pigeon, MD).  Those results are now back, the report date for which is November 24, 2014.  Genetic testing was normal, and did not reveal the known familial mutation. The test report will be scanned into EPIC and will be located under the Media tab.   CANCER SCREENING RECOMMENDATIONS: We call this result a true negative  result because the cancer-causing mutation was identified in Ms. Salinas's family, and she did not inherit it.  Given this negative result, Ms. Hayse's chances of developing PALB2-related cancers are the same as they are in the general population.  We, therefore, recommended  Ms. Arriaga continue to follow the cancer screening guidelines provided by her primary healthcare providers.   RECOMMENDATIONS FOR FAMILY MEMBERS: Because we still do not have an explanation for the maternal family history of breast cancer, women in this family might be at some increased risk of developing cancer, over the general population risk, simply due to the family history of cancer. We recommended women in this family have a yearly mammogram beginning at age 69, or 71 years younger than the earliest onset of cancer, an an annual clinical breast exam, and perform monthly breast self-exams. Women in this family should also have a gynecological exam as recommended by their primary provider. All family members should have a colonoscopy by age 74.  Those who are still at-risk for having inherited the [facility] in [place] should be screened accordingly until the time at which they have negative genetic testing.  Ms. Andreen's father and brother should have genetic counseling and testing for the [facility].  Ms. Glogowski's sister's children are eligible for genetic testing if they are 66 years of age or older, and they should have genetic counseling before doing so.   FOLLOW-UP: Lastly, we discussed with Ms. Arteaga that cancer genetics is a rapidly advancing field and it is possible that new genetic tests will be appropriate for her and/or her family members in the future. We encouraged her to remain in contact with cancer genetics on  an annual basis so we can update her personal and family histories and let her know of advances in cancer genetics that may benefit this family.   Our contact number was provided. Ms. Padovano's  questions were answered to her satisfaction, and she knows she is welcome to call us at anytime with additional questions or concerns.   Jeanine Luz, MS Genetic Counselor [email or web address] Phone: [phone]

## 2014-11-27 NOTE — Telephone Encounter (Signed)
Discussed with Nicole Dixon that her genetic testing was negative for the mutation in the PALB2 gene that was originally found in her twin sister.  This is a true negative result because we knew exactly what we were looking for and we did not find it.  Her children would not be at risk for inheriting this mutation because she did not inherit it and therefore would not be able to pass it down.  Future cancer screening does not change based on this result, but she should follow guidelines as recommended by her primary care provider.

## 2015-05-19 ENCOUNTER — Ambulatory Visit (INDEPENDENT_AMBULATORY_CARE_PROVIDER_SITE_OTHER)
Admission: RE | Admit: 2015-05-19 | Discharge: 2015-05-19 | Disposition: A | Discharge status: Home / Self Care | Payer: 59 | Source: Ambulatory Visit | Attending: Family Medicine | Admitting: Family Medicine

## 2015-05-19 ENCOUNTER — Other Ambulatory Visit (INDEPENDENT_AMBULATORY_CARE_PROVIDER_SITE_OTHER): Payer: 59

## 2015-05-19 ENCOUNTER — Encounter: Payer: Self-pay | Admitting: Family Medicine

## 2015-05-19 ENCOUNTER — Ambulatory Visit (INDEPENDENT_AMBULATORY_CARE_PROVIDER_SITE_OTHER): Payer: 59 | Admitting: Family Medicine

## 2015-05-19 VITALS — BP 112/70 | HR 78 | Ht 65.0 in | Wt 121.0 lb

## 2015-05-19 DIAGNOSIS — S83419A Sprain of medial collateral ligament of unspecified knee, initial encounter: Secondary | ICD-10-CM | POA: Insufficient documentation

## 2015-05-19 DIAGNOSIS — Z9889 Other specified postprocedural states: Secondary | ICD-10-CM | POA: Diagnosis not present

## 2015-05-19 DIAGNOSIS — S83412A Sprain of medial collateral ligament of left knee, initial encounter: Secondary | ICD-10-CM

## 2015-05-19 DIAGNOSIS — M25562 Pain in left knee: Secondary | ICD-10-CM | POA: Diagnosis not present

## 2015-05-19 NOTE — Patient Instructions (Addendum)
Good to see you We will get an xray downstairs today Ice is your friend Ice 20 minutes 2 times daily. Usually after activity and before bed. Duexis 3 times a day for 3 days Continue the body helix with exercises Avoid jumping, twisting, or sprinting.  OK to do things in a straight line and with feet planted.  See me again in 3 weeks to make sure all better or call again if it swells.  Happy New Year!

## 2015-05-19 NOTE — Progress Notes (Signed)
Pre visit review using our clinic review tool, if applicable. No additional management support is needed unless otherwise documented below in the visit note. 

## 2015-05-19 NOTE — Progress Notes (Signed)
[name] [address], [place] [address] Phone: [phone] Subjective:    CC: Leg pain. Knee pain follow-up  [id number] Nicole Dixon is a 39 y.o. female coming in with complaint of left knee pain.  Patient 1 year ago was seen and did have a popliteal bursitis of the left knee. Patient states 10 days ago she was at her son's basketball game and was jumping up and down and felt a instability in her left knee. States the next day she had significant amount of swelling. Over the course of time it seemed to improve slowly. Patient states though it still has a feeling of instability. Mild discomfort noted over the medial aspect of the knee. Rates the severity of pain a 5 out of 10. Has not taken any over-the-counter medications at this time. Not affecting her daily activities or waking her up at night.      Past Medical History  Diagnosis Date  . Family history of breast cancer    No past surgical history on file.Surgical history for anterior cruciate ligament in 1994  Social History  Substance Use Topics  . Smoking status: Never Smoker   . Smokeless tobacco: None  . Alcohol Use: 0.0 oz/week    0 Standard drinks or equivalent per week   Not on File Family History  Problem Relation Age of Onset  . Breast cancer Sister 40    PALB2+  . Breast cancer Maternal Aunt 64  . Bladder Cancer Maternal Aunt 64  . Dementia Maternal Grandmother   . Breast cancer Paternal Grandmother     dx in her 23s  . Breast cancer Cousin     dx in her late 54s; BRCA-  . Cancer Cousin     leiomyosarcoma  . Breast cancer Other     MGMs sister  . Breast cancer Other     MGMs sister  . Cancer Other     PGFs twin brother with Cancer NOS     Past medical history, social, surgical and family history all reviewed in electronic medical record.   Review of Systems: No headache, visual changes, nausea, vomiting, diarrhea, constipation, dizziness, abdominal  pain, skin rash, fevers, chills, night sweats, weight loss, swollen lymph nodes, body aches, joint swelling, muscle aches, chest pain, shortness of breath, mood changes.   Objective Blood pressure 112/70, pulse 78, height 5' 5"  (1.651 m), weight 121 lb (54.885 kg), SpO2 94 %.  General: No apparent distress alert and oriented x3 mood and affect normal, dressed appropriately.  HEENT: Pupils equal, extraocular movements intact  Respiratory: Patient's speak in full sentences and does not appear short of breath  Cardiovascular: No lower extremity edema, non tender, no erythema  Skin: Warm dry intact with no signs of infection or rash on extremities or on axial skeleton.  Abdomen: Soft nontender  Neuro: Cranial nerves II through XII are intact, neurovascularly intact in all extremities with 2+ DTRs and 2+ pulses.  Lymph: No lymphadenopathy of posterior or anterior cervical chain or axillae bilaterally.  Gait normal with good balance and coordination.  Patient's running gait though shows the patient's knees to cross midline significantly and does have overpronation of the hindfoot. Patient is a heelstriker MSK:  Non tender with full range of motion and good stability and symmetric strength and tone of shoulders, elbows, wrist,  bilaterally.    Knee:leftNormal to inspection with patient's incision from previous surgery well-healed  Patient is tender to palpation over themedialt  line. ROM full in flexion and extension and lower leg rotation. Ligaments with solid consistent endpoints including ACL, PCL, LCL,the patient does have some mild laxity compared to the contralateral side  MCL. Negative Mcmurray's, Apley's, and Thessalonian tests. Non painful patellar compression. Patellar glide without crepitus. Patellar and quadriceps tendons unremarkable. Hamstring and quadriceps strength is normal.  Contralateral knee unremarkable.  MSK US performed of: left knee This study was ordered, performed, and  interpreted by Charlann Boxer D.O.  Knee: All structures visualized. Resolving knee effusion noted.  Anteromedial, anterolateral, posteromedial, and posterolateral menisci unremarkable without tearing, fraying, effusion, or displacement. Popliteus tendon does have hypoechoic changes but no true tear patient. Patellar Tendon unremarkable on long and transverse views without effusion. No abnormality of prepatellar bursa. LCL unremarkable but patient's MCL does have an articular side tear proximally 10%. Mild hypoechoic changes. No significant increase in Doppler flow. Images stored in hard drive  IMPRESSION: MCL sprain  Procedure note  40347; 15 minutes spent for Therapeutic exercises as stated in above notes.  This included exercises focusing on stretching, strengthening, with significant focus on eccentric aspects. Vastus medialis oblique exercises as well as hip abductor exercises. Focusing on hamstring stretching as well as strengthening of the abductors to avoid any significant impact on the MCL.  Proper technique shown and discussed handout in great detail with ATC.  All questions were discussed and answered.     Impression and Recommendations:     This case required medical decision making of moderate complexity.

## 2015-05-19 NOTE — Assessment & Plan Note (Signed)
Patient does have a sprain of the MCL. Mild tear of approximately 10% of the articular surface. No retraction noted. Patient does have an past medical history significant for an anterior cruciate ligament repair. It appears to be intact today. X-rays are pending. Patient will try conservative therapy with home exercises, icing, as well as bracing. Patient and will come back and see me again in 3 weeks after this conservative therapy and we'll see how patient response. Differential also includes a deeper meniscal tear but not seen on ultrasound today. Patient has any significant instability of the knee that seems to worsen over the course of time advance imaging may be warranted.

## 2015-06-09 ENCOUNTER — Ambulatory Visit: Payer: 59 | Admitting: Family Medicine

## 2015-06-16 ENCOUNTER — Ambulatory Visit: Payer: 59 | Admitting: Family Medicine

## 2015-06-28 ENCOUNTER — Ambulatory Visit (INDEPENDENT_AMBULATORY_CARE_PROVIDER_SITE_OTHER): Payer: 59 | Admitting: Family Medicine

## 2015-06-28 ENCOUNTER — Other Ambulatory Visit (INDEPENDENT_AMBULATORY_CARE_PROVIDER_SITE_OTHER): Payer: 59

## 2015-06-28 VITALS — BP 110/74 | HR 74 | Wt 120.0 lb

## 2015-06-28 DIAGNOSIS — S83412D Sprain of medial collateral ligament of left knee, subsequent encounter: Secondary | ICD-10-CM | POA: Diagnosis not present

## 2015-06-28 NOTE — Progress Notes (Signed)
[name] [address] [place], [place] [address] Phone: [phone] Subjective:    CC: Leg pain. Knee pain follow-up  [id number] Nicole Dixon is a 40 y.o. female coming in with complaint of left knee pain.  Patient 1 year ago was seen and did have a popliteal bursitis of the left knee. More recently though patient was found to have more of an mild MCL sprain. Patient overall is feeling significantly better. Has not had significant time to work out because she is in the process of moving. Notices with lateral translation she does have some difficulty. Rates the severity of 110. No true pain with any of her regular daily activities.      Past Medical History  Diagnosis Date  . Family history of breast cancer    No past surgical history on file.Surgical history for anterior cruciate ligament in 1994  Social History  Substance Use Topics  . Smoking status: Never Smoker   . Smokeless tobacco: Not on file  . Alcohol Use: 0.0 oz/week    0 Standard drinks or equivalent per week   Not on File Family History  Problem Relation Age of Onset  . Breast cancer Sister 40    PALB2+  . Breast cancer Maternal Aunt 64  . Bladder Cancer Maternal Aunt 64  . Dementia Maternal Grandmother   . Breast cancer Paternal Grandmother     dx in her 34s  . Breast cancer Cousin     dx in her late 25s; BRCA-  . Cancer Cousin     leiomyosarcoma  . Breast cancer Other     MGMs sister  . Breast cancer Other     MGMs sister  . Cancer Other     PGFs twin brother with Cancer NOS     Past medical history, social, surgical and family history all reviewed in electronic medical record.   Review of Systems: No headache, visual changes, nausea, vomiting, diarrhea, constipation, dizziness, abdominal pain, skin rash, fevers, chills, night sweats, weight loss, swollen lymph nodes, body aches, joint swelling, muscle aches, chest pain, shortness of breath, mood changes.    Objective Blood pressure 110/74, pulse 74, weight 120 lb (54.432 kg), SpO2 98 %.  General: No apparent distress alert and oriented x3 mood and affect normal, dressed appropriately.  HEENT: Pupils equal, extraocular movements intact  Respiratory: Patient's speak in full sentences and does not appear short of breath  Cardiovascular: No lower extremity edema, non tender, no erythema  Skin: Warm dry intact with no signs of infection or rash on extremities or on axial skeleton.  Abdomen: Soft nontender  Neuro: Cranial nerves II through XII are intact, neurovascularly intact in all extremities with 2+ DTRs and 2+ pulses.  Lymph: No lymphadenopathy of posterior or anterior cervical chain or axillae bilaterally.  Gait normal with good balance and coordination.  Patient's running gait though shows the patient's knees to cross midline significantly and does have overpronation of the hindfoot. Patient is a heelstriker MSK:  Non tender with full range of motion and good stability and symmetric strength and tone of shoulders, elbows, wrist,  bilaterally.    Knee:left Normal to inspection with patient's incision from previous surgery well-healed  Nontender on exam. ROM full in flexion and extension and lower leg rotation. Ligaments with solid consistent endpoints including ACL, PCL, LCL, MCL seems to be symmetric to the contralateral side Negative Mcmurray's, Apley's, and Thessalonian tests. Non painful patellar compression. Patellar glide without crepitus.  Patellar and quadriceps tendons unremarkable. Hamstring and quadriceps strength is normal.  Contralateral knee unremarkable.  MSK US performed of: left knee This study was ordered, performed, and interpreted by Charlann Boxer D.O.  Knee: All structures visualized. Resolving knee effusion noted.  Anteromedial, anterolateral, posteromedial, and posterolateral menisci unremarkable without tearing, fraying, effusion, or displacement. Popliteus  tendon does have hypoechoic changes but no true tear patient. Patellar Tendon unremarkable on long and transverse views without effusion. No abnormality of prepatellar bursa. LCL unremarkable but patient's MCL no significant tear hypoechoic changes noted. Very minimal hypoechoic changes on the insertional area. 95% better than previously. Images stored and internal or drive  IMPRESSION: MCL normal       Impression and Recommendations:     This case required medical decision making of moderate complexity.

## 2015-06-28 NOTE — Assessment & Plan Note (Signed)
Significantly improved at this time. No change management. Exercise prescription given. No limitations. Warned though to avoid lateral translation of the knee. Follow-up as needed.

## 2015-06-28 NOTE — Patient Instructions (Signed)
Verbal instructions given

## 2017-10-09 ENCOUNTER — Encounter: Payer: Self-pay | Admitting: Family Medicine

## 2017-10-10 NOTE — Progress Notes (Signed)
[facility] [address], [place] [address] Phone: [phone] Subjective:      CC:  Left shoulder pain  [id number]  Nicole Dixon is a 42 y.o. female coming in with complaint of left shoulder pain. States that her arm feels heavy.   Onset- Chronic Location- Varies Duration- All day Character- Sharp with movement, throbs down to elbow Aggravating factors- flexion, abduction Reliving factors-  Therapies tried- Ice, Tylenol, Ibuprofen Severity-7 out of 10      Past Medical History:  Diagnosis Date  . Family history of breast cancer    No past surgical history on file. Social History   Socioeconomic History  . Marital status: Married    Spouse name: Not on file  . Number of children: 4  . Years of education: Not on file  . Highest education level: Not on file  Occupational History  . Not on file  Social Needs  . Financial resource strain: Not on file  . Food insecurity:    Worry: Not on file    Inability: Not on file  . Transportation needs:    Medical: Not on file    Non-medical: Not on file  Tobacco Use  . Smoking status: Never Smoker  . Smokeless tobacco: Never Used  Substance and Sexual Activity  . Alcohol use: Yes    Alcohol/week: 0.0 oz  . Drug use: Not on file  . Sexual activity: Not on file  Lifestyle  . Physical activity:    Days per week: Not on file    Minutes per session: Not on file  . Stress: Not on file  Relationships  . Social connections:    Talks on phone: Not on file    Gets together: Not on file    Attends religious service: Not on file    Active member of club or organization: Not on file    Attends meetings of clubs or organizations: Not on file    Relationship status: Not on file  Other Topics Concern  . Not on file  Social History Narrative  . Not on file   Not on File Family History  Problem Relation Age of Onset  . Breast cancer Sister 40       PALB2+  . Breast cancer  Maternal Aunt 64  . Bladder Cancer Maternal Aunt 64  . Dementia Maternal Grandmother   . Breast cancer Paternal Grandmother        dx in her 73s  . Breast cancer Cousin        dx in her late 77s; BRCA-  . Cancer Cousin        leiomyosarcoma  . Breast cancer Other        MGMs sister  . Breast cancer Other        MGMs sister  . Cancer Other        PGFs twin brother with Cancer NOS     Past medical history, social, surgical and family history all reviewed in electronic medical record.  No pertanent information unless stated regarding to the chief complaint.   Review of Systems:Review of systems updated and as accurate as of 10/11/17  No headache, visual changes, nausea, vomiting, diarrhea, constipation, dizziness, abdominal pain, skin rash, fevers, chills, night sweats, weight loss, swollen lymph nodes, body aches, joint swelling, chest pain, shortness of breath, mood changes.  Positive muscle aches  Objective  Blood pressure 110/76, pulse 76, height 5' 5"  (1.651 m), weight 120  lb (54.4 kg), SpO2 98 %. Systems examined below as of 10/11/17   General: No apparent distress alert and oriented x3 mood and affect normal, dressed appropriately.  HEENT: Pupils equal, extraocular movements intact  Respiratory: Patient's speak in full sentences and does not appear short of breath  Cardiovascular: No lower extremity edema, non tender, no erythema  Skin: Warm dry intact with no signs of infection or rash on extremities or on axial skeleton.  Abdomen: Soft nontender  Neuro: Cranial nerves II through XII are intact, neurovascularly intact in all extremities with 2+ DTRs and 2+ pulses.  Lymph: No lymphadenopathy of posterior or anterior cervical chain or axillae bilaterally.  Gait normal with good balance and coordination.  MSK:  Non tender with full range of motion and good stability and symmetric strength and tone of  elbows, wrist, hip, knee and ankles bilaterally.  Shoulder: left Inspection  reveals no abnormalities, atrophy or asymmetry. Palpation is normal with no tenderness over AC joint or bicipital groove. ROM is full in all planes passively. Rotator cuff strength normal throughout. signs of impingement with positive Neer and Hawkin's tests, but negative empty can sign. Speeds and Yergason's tests normal. No labral pathology noted with negative Obrien's, negative clunk and good stability. Normal scapular function observed. No painful arc and no drop arm sign. No apprehension sign Slipped rib on the left   MSK US performed of: left This study was ordered, performed, and interpreted by Charlann Boxer D.O.  Shoulder:   Supraspinatus:  Appears normal on long and transverse views, Bursal bulge seen with shoulder abduction on impingement view. Infraspinatus:  Appears normal on long and transverse views. Significant increase in Doppler flow Subscapularis:  Appears normal on long and transverse views. Positive bursa Teres Minor:  Appears normal on long and transverse views. AC joint:  Capsule undistended, no geyser sign. Glenohumeral Joint:  Appears normal without effusion. Glenoid Labrum:  Intact without visualized tears. Biceps Tendon:  Appears normal on long and transverse views, no fraying of tendon, tendon located in intertubercular groove, no subluxation with shoulder internal or external rotation.  Impression: Subacromial bursitis  Osteopathic findings C2 flexed rotated and side bent right T3 extended rotated and side bent left inhaled third rib T9 extended rotated and side bent left L2 flexed rotated and side bent right Sacrum right on right    97110; 15 additional minutes spent for Therapeutic exercises as stated in above notes.  This included exercises focusing on stretching, strengthening, with significant focus on eccentric aspects.   Long term goals include an improvement in range of motion, strength, endurance as well as avoiding reinjury. Patient's frequency  would include in 1-2 times a day, 3-5 times a week for a duration of 6-12 weeks.Shoulder Exercises that included:  Basic scapular stabilization to include adduction and depression of scapula Scaption, focusing on proper movement and good control Internal and External rotation utilizing a theraband, with elbow tucked at side entire time Rows with theraband given    Proper technique shown and discussed handout in great detail with ATC.  All questions were discussed and answered.   Impression and Recommendations:     This case required medical decision making of moderate complexity.      Note: This dictation was prepared with Dragon dictation along with smaller phrase technology. Any transcriptional errors that result from this process are unintentional.

## 2017-10-11 ENCOUNTER — Ambulatory Visit: Payer: 59 | Admitting: Family Medicine

## 2017-10-11 ENCOUNTER — Ambulatory Visit: Payer: Self-pay

## 2017-10-11 ENCOUNTER — Encounter: Payer: Self-pay | Admitting: Family Medicine

## 2017-10-11 VITALS — BP 110/76 | HR 76 | Ht 65.0 in | Wt 120.0 lb

## 2017-10-11 DIAGNOSIS — M25512 Pain in left shoulder: Secondary | ICD-10-CM

## 2017-10-11 DIAGNOSIS — M999 Biomechanical lesion, unspecified: Secondary | ICD-10-CM | POA: Diagnosis not present

## 2017-10-11 DIAGNOSIS — M7552 Bursitis of left shoulder: Secondary | ICD-10-CM

## 2017-10-11 DIAGNOSIS — G8929 Other chronic pain: Secondary | ICD-10-CM

## 2017-10-11 DIAGNOSIS — M940 Chondrocostal junction syndrome [Tietze]: Secondary | ICD-10-CM

## 2017-10-11 MED ORDER — DICLOFENAC SODIUM 2 % TD SOLN: 2.0000 g | Freq: Two times a day (BID) | TRANSDERMAL | 3 refills | Status: DC

## 2017-10-11 NOTE — Assessment & Plan Note (Signed)
Decision today to treat with OMT was based on Physical Exam  After verbal consent patient was treated with HVLA, ME, FPR techniques in cervical, thoracic, rib,  areas  Patient tolerated the procedure well with improvement in symptoms  Patient given exercises, stretches and lifestyle modifications  See medications in patient instructions if given  Patient will follow up in 4 weeks 

## 2017-10-11 NOTE — Assessment & Plan Note (Signed)
Shoulder bursitis.  Discussed icing regimen and home exercise.  Discussed which activities to doing which wants to avoid.  Patient is to increase activity as tolerated.  Keep hands within peripheral vision.  Work with Product/process development scientist to learn home exercises in greater detail.  Follow-up again in 4 to 8 weeks

## 2017-10-11 NOTE — Assessment & Plan Note (Signed)
Slipped rib syndrome.  Discussed icing regimen and home exercises.  Discussed posture and ergonomics and scapular dyskinesis.  Follow-up again in 4 weeks responded well to manipulation

## 2017-10-11 NOTE — Patient Instructions (Signed)
Good to see you.  Ice 20 minutes 2 times daily. Usually after activity and before bed. Exercises 3 times a week.  Keep hands within peripheral vision  Stay active We will watch the rib pennsaid pinkie amount topically 2 times daily as needed.  See me again in 4 weeks to make sure all the way better

## 2017-10-16 ENCOUNTER — Encounter

## 2017-10-16 ENCOUNTER — Ambulatory Visit: Payer: 59 | Admitting: Family Medicine

## 2017-11-14 NOTE — Progress Notes (Deleted)
[name] [place] [place], [place] [address] Phone: [phone] Subjective:    I'm seeing this patient by the request  of:    CC:   [id number]  Nicole Dixon is a 42 y.o. female coming in with complaint of ***  Onset-  Location Duration-  Character- Aggravating factors- Reliving factors-  Therapies tried-  Severity-     Past Medical History:  Diagnosis Date  . Family history of breast cancer    No past surgical history on file. Social History   Socioeconomic History  . Marital status: Married    Spouse name: Not on file  . Number of children: 4  . Years of education: Not on file  . Highest education level: Not on file  Occupational History  . Not on file  Social Needs  . Financial resource strain: Not on file  . Food insecurity:    Worry: Not on file    Inability: Not on file  . Transportation needs:    Medical: Not on file    Non-medical: Not on file  Tobacco Use  . Smoking status: Never Smoker  . Smokeless tobacco: Never Used  Substance and Sexual Activity  . Alcohol use: Yes    Alcohol/week: 0.0 oz  . Drug use: Not on file  . Sexual activity: Not on file  Lifestyle  . Physical activity:    Days per week: Not on file    Minutes per session: Not on file  . Stress: Not on file  Relationships  . Social connections:    Talks on phone: Not on file    Gets together: Not on file    Attends religious service: Not on file    Active member of club or organization: Not on file    Attends meetings of clubs or organizations: Not on file    Relationship status: Not on file  Other Topics Concern  . Not on file  Social History Narrative  . Not on file   Not on File Family History  Problem Relation Age of Onset  . Breast cancer Sister 40       PALB2+  . Breast cancer Maternal Aunt 64  . Bladder Cancer Maternal Aunt 64  . Dementia Maternal Grandmother   . Breast cancer Paternal Grandmother        dx in  her 13s  . Breast cancer Cousin        dx in her late 57s; BRCA-  . Cancer Cousin        leiomyosarcoma  . Breast cancer Other        MGMs sister  . Breast cancer Other        MGMs sister  . Cancer Other        PGFs twin brother with Cancer NOS     Past medical history, social, surgical and family history all reviewed in electronic medical record.  No pertanent information unless stated regarding to the chief complaint.   Review of Systems:Review of systems updated and as accurate as of 11/14/17  No headache, visual changes, nausea, vomiting, diarrhea, constipation, dizziness, abdominal pain, skin rash, fevers, chills, night sweats, weight loss, swollen lymph nodes, body aches, joint swelling, muscle aches, chest pain, shortness of breath, mood changes.   Objective  There were no vitals taken for this visit. Systems examined below as of 11/14/17   General: No apparent distress alert and oriented x3 mood and affect normal, dressed appropriately.  HEENT:  Pupils equal, extraocular movements intact  Respiratory: Patient's speak in full sentences and does not appear short of breath  Cardiovascular: No lower extremity edema, non tender, no erythema  Skin: Warm dry intact with no signs of infection or rash on extremities or on axial skeleton.  Abdomen: Soft nontender  Neuro: Cranial nerves II through XII are intact, neurovascularly intact in all extremities with 2+ DTRs and 2+ pulses.  Lymph: No lymphadenopathy of posterior or anterior cervical chain or axillae bilaterally.  Gait normal with good balance and coordination.  MSK:  Non tender with full range of motion and good stability and symmetric strength and tone of shoulders, elbows, wrist, hip, knee and ankles bilaterally.     Impression and Recommendations:     This case required medical decision making of moderate complexity.      Note: This dictation was prepared with Dragon dictation along with smaller phrase technology.  Any transcriptional errors that result from this process are unintentional.

## 2017-11-15 ENCOUNTER — Ambulatory Visit: Payer: 59 | Admitting: Family Medicine

## 2017-12-17 NOTE — Progress Notes (Signed)
[name] [address], [place] [address] Phone: [phone] Subjective:    I'm seeing this patient by the request  of:    CC: Shoulder pain NGE:[id number]  Nicole Dixon is a 42 y.o. female coming in with complaint of shoulder pain. Right AC joint is painful today. States the pain comes and goes. Left shoulder is good. TTP. No numbness and tingling noted.   Onset- last visit Location= Right AC joint Duration-weeks Character- Achy, dull Aggravating factors-certain movements really not Reliving factors-decreasing movement Therapies tried- Pennsaid Severity-5 out of 10     Past Medical History:  Diagnosis Date  . Family history of breast cancer    No past surgical history on file. Social History   Socioeconomic History  . Marital status: Married    Spouse name: Not on file  . Number of children: 4  . Years of education: Not on file  . Highest education level: Not on file  Occupational History  . Not on file  Social Needs  . Financial resource strain: Not on file  . Food insecurity:    Worry: Not on file    Inability: Not on file  . Transportation needs:    Medical: Not on file    Non-medical: Not on file  Tobacco Use  . Smoking status: Never Smoker  . Smokeless tobacco: Never Used  Substance and Sexual Activity  . Alcohol use: Yes    Alcohol/week: 0.0 oz  . Drug use: Not on file  . Sexual activity: Not on file  Lifestyle  . Physical activity:    Days per week: Not on file    Minutes per session: Not on file  . Stress: Not on file  Relationships  . Social connections:    Talks on phone: Not on file    Gets together: Not on file    Attends religious service: Not on file    Active member of club or organization: Not on file    Attends meetings of clubs or organizations: Not on file    Relationship status: Not on file  Other Topics Concern  . Not on file  Social History Narrative  . Not on file   Not on  File Family History  Problem Relation Age of Onset  . Breast cancer Sister 40       PALB2+  . Breast cancer Maternal Aunt 64  . Bladder Cancer Maternal Aunt 64  . Dementia Maternal Grandmother   . Breast cancer Paternal Grandmother        dx in her 57s  . Breast cancer Cousin        dx in her late 33s; BRCA-  . Cancer Cousin        leiomyosarcoma  . Breast cancer Other        MGMs sister  . Breast cancer Other        MGMs sister  . Cancer Other        PGFs twin brother with Cancer NOS     Past medical history, social, surgical and family history all reviewed in electronic medical record.  No pertanent information unless stated regarding to the chief complaint.   Review of Systems:Review of systems updated and as accurate as of 12/18/17  No headache, visual changes, nausea, vomiting, diarrhea, constipation, dizziness, abdominal pain, skin rash, fevers, chills, night sweats, weight loss, swollen lymph nodes, body aches, joint swelling, muscle aches, chest pain, shortness of breath, mood changes.  Objective  Blood pressure 98/62, pulse 83, height 5' 5" (1.651 m), SpO2 98 %. Systems examined below as of 12/18/17   General: No apparent distress alert and oriented x3 mood and affect normal, dressed appropriately.  HEENT: Pupils equal, extraocular movements intact  Respiratory: Patient's speak in full sentences and does not appear short of breath  Cardiovascular: No lower extremity edema, non tender, no erythema  Skin: Warm dry intact with no signs of infection or rash on extremities or on axial skeleton.  Abdomen: Soft nontender  Neuro: Cranial nerves II through XII are intact, neurovascularly intact in all extremities with 2+ DTRs and 2+ pulses.  Lymph: No lymphadenopathy of posterior or anterior cervical chain or axillae bilaterally.  Gait normal with good balance and coordination.  MSK:  Non tender with full range of motion and good stability and symmetric strength and tone of   elbows, wrist, hip, knee and ankles bilaterally.  Right shoulder exam shows some mild swelling over the AC joint.  Positive crossover sign.  Minimal impingement noted.  Full range of motion.  Nontender on exam.  Contralateral shoulder unremarkable.  Osteopathic findings C2 flexed rotated and side bent right T3 extended rotated and side bent right inhaled third rib  L1 flexed rotated and side bent right Sacrum right on right  Procedure: Real-time Ultrasound Guided Injection of right acromioclavicular joint Device: GE Logiq Q7 Ultrasound guided injection is preferred based studies that show increased duration, increased effect, greater accuracy, decreased procedural pain, increased response rate, and decreased cost with ultrasound guided versus blind injection.  Verbal informed consent obtained.  Time-out conducted.  Noted no overlying erythema, induration, or other signs of local infection.  Skin prepped in a sterile fashion.  Local anesthesia: Topical Ethyl chloride.  With sterile technique and under real time ultrasound guidance: 25-gauge half inch needle patient was injected with 0.5 cc of 0.5% Marcaine and 0.5 cc of Kenalog 40 mg/mL Completed without difficulty  Pain immediately resolved suggesting accurate placement of the medication.  Advised to call if fevers/chills, erythema, induration, drainage, or persistent bleeding.  Images permanently stored and available for review in the ultrasound unit.  Impression: Technically successful ultrasound guided injection.   Impression and Recommendations:     This case required medical decision making of moderate complexity.      Note: This dictation was prepared with Dragon dictation along with smaller phrase technology. Any transcriptional errors that result from this process are unintentional.        

## 2017-12-18 ENCOUNTER — Ambulatory Visit: Payer: Self-pay

## 2017-12-18 ENCOUNTER — Ambulatory Visit: Payer: 59 | Admitting: Family Medicine

## 2017-12-18 ENCOUNTER — Encounter: Payer: Self-pay | Admitting: Family Medicine

## 2017-12-18 VITALS — BP 98/62 | HR 83 | Ht 65.0 in

## 2017-12-18 DIAGNOSIS — M999 Biomechanical lesion, unspecified: Secondary | ICD-10-CM | POA: Diagnosis not present

## 2017-12-18 DIAGNOSIS — M25511 Pain in right shoulder: Principal | ICD-10-CM

## 2017-12-18 DIAGNOSIS — M940 Chondrocostal junction syndrome [Tietze]: Secondary | ICD-10-CM

## 2017-12-18 DIAGNOSIS — S4351XA Sprain of right acromioclavicular joint, initial encounter: Secondary | ICD-10-CM

## 2017-12-18 DIAGNOSIS — G8929 Other chronic pain: Secondary | ICD-10-CM | POA: Diagnosis not present

## 2017-12-18 NOTE — Assessment & Plan Note (Signed)
Decision today to treat with OMT was based on Physical Exam  After verbal consent patient was treated with HVLA, ME, FPR techniques in cervical, thoracic, rib lumbar and sacral areas  Patient tolerated the procedure well with improvement in symptoms  Patient given exercises, stretches and lifestyle modifications  See medications in patient instructions if given  Patient will follow up in 4-8 weeks 

## 2017-12-18 NOTE — Assessment & Plan Note (Signed)
Stable.  Home exercises, discussed scapular strengthening, which activities to doing which wants to avoid.  Follow-up again in 4 to 8 weeks

## 2017-12-18 NOTE — Assessment & Plan Note (Signed)
Patient given injection and tolerated the procedure well.  Discussed icing regimen and home exercises.  Discussed which activities of doing which wants to avoid.  Topical anti-inflammatories refilled.  Follow-up again in 4 to 8 weeks

## 2017-12-18 NOTE — Patient Instructions (Signed)
Good to see you  Ice is your friend Stay active Keep hands within peripheral vision  Sorry I made you all hot and bother Make an appointment in 4 weeks just in case

## 2018-01-04 ENCOUNTER — Other Ambulatory Visit: Payer: Self-pay

## 2018-01-04 ENCOUNTER — Encounter (HOSPITAL_COMMUNITY): Payer: Self-pay | Admitting: Emergency Medicine

## 2018-01-04 ENCOUNTER — Emergency Department (HOSPITAL_COMMUNITY)
Admission: EM | Admit: 2018-01-04 | Discharge: 2018-01-05 | Disposition: A | Discharge status: Home / Self Care | Payer: 59 | Attending: Emergency Medicine | Admitting: Emergency Medicine

## 2018-01-04 DIAGNOSIS — R109 Unspecified abdominal pain: Secondary | ICD-10-CM | POA: Diagnosis not present

## 2018-01-04 DIAGNOSIS — R1030 Lower abdominal pain, unspecified: Secondary | ICD-10-CM | POA: Diagnosis present

## 2018-01-04 DIAGNOSIS — K5732 Diverticulitis of large intestine without perforation or abscess without bleeding: Secondary | ICD-10-CM | POA: Diagnosis not present

## 2018-01-04 DIAGNOSIS — K5792 Diverticulitis of intestine, part unspecified, without perforation or abscess without bleeding: Secondary | ICD-10-CM | POA: Diagnosis not present

## 2018-01-04 DIAGNOSIS — Z79899 Other long term (current) drug therapy: Secondary | ICD-10-CM | POA: Diagnosis not present

## 2018-01-04 HISTORY — DX: Gastritis, unspecified, without bleeding: K29.70

## 2018-01-04 NOTE — ED Triage Notes (Addendum)
C/o dull achy lower abd pain since yesterday with intermittent sharp pain.  Pain worse with palpation. Also c/o nausea.  Denies vomiting, diarrhea, and urinary complaints.

## 2018-01-05 ENCOUNTER — Emergency Department (HOSPITAL_COMMUNITY): Payer: 59

## 2018-01-05 DIAGNOSIS — R109 Unspecified abdominal pain: Secondary | ICD-10-CM | POA: Diagnosis not present

## 2018-01-05 DIAGNOSIS — Z79899 Other long term (current) drug therapy: Secondary | ICD-10-CM | POA: Diagnosis not present

## 2018-01-05 DIAGNOSIS — K5792 Diverticulitis of intestine, part unspecified, without perforation or abscess without bleeding: Secondary | ICD-10-CM | POA: Diagnosis not present

## 2018-01-05 LAB — COMPREHENSIVE METABOLIC PANEL
ALK PHOS: 65 U/L (ref 38–126)
ALT: 25 U/L (ref 0–44)
AST: 64 U/L — AB (ref 15–41)
Albumin: 3.9 g/dL (ref 3.5–5.0)
Anion gap: 10 (ref 5–15)
BUN: 10 mg/dL (ref 6–20)
CALCIUM: 9.3 mg/dL (ref 8.9–10.3)
CHLORIDE: 100 mmol/L (ref 98–111)
CO2: 28 mmol/L (ref 22–32)
CREATININE: 0.98 mg/dL (ref 0.44–1.00)
GFR calc Af Amer: >60 mL/min (ref >60)
Glucose, Bld: 109 mg/dL — ABNORMAL HIGH (ref 70–99)
Potassium: 5.4 mmol/L — ABNORMAL HIGH (ref 3.5–5.1)
SODIUM: 138 mmol/L (ref 135–145)
Total Bilirubin: 1.8 mg/dL — ABNORMAL HIGH (ref 0.3–1.2)
Total Protein: 6.6 g/dL (ref 6.5–8.1)

## 2018-01-05 LAB — URINALYSIS, ROUTINE W REFLEX MICROSCOPIC
Bilirubin Urine: NEGATIVE
Glucose, UA: NEGATIVE mg/dL
Hgb urine dipstick: NEGATIVE
Ketones, ur: NEGATIVE mg/dL
Leukocytes, UA: NEGATIVE
Nitrite: NEGATIVE
Protein, ur: NEGATIVE mg/dL
Specific Gravity, Urine: 1.012 (ref 1.005–1.030)
pH: 7 (ref 5.0–8.0)

## 2018-01-05 LAB — CBC
HCT: 42.7 % (ref 36.0–46.0)
Hemoglobin: 13.8 g/dL (ref 12.0–15.0)
MCH: 30.3 pg (ref 26.0–34.0)
MCHC: 32.3 g/dL (ref 30.0–36.0)
MCV: 93.6 fL (ref 78.0–100.0)
PLATELETS: 201 10*3/uL (ref 150–400)
RBC: 4.56 MIL/uL (ref 3.87–5.11)
RDW: 13.2 % (ref 11.5–15.5)
WBC: 11.1 10*3/uL — ABNORMAL HIGH (ref 4.0–10.5)

## 2018-01-05 LAB — I-STAT BETA HCG BLOOD, ED (MC, WL, AP ONLY): I-stat hCG, quantitative: <5 m[IU]/mL

## 2018-01-05 LAB — LIPASE, BLOOD: Lipase: 35 U/L (ref 11–51)

## 2018-01-05 LAB — I-STAT CG4 LACTIC ACID, ED: Lactic Acid, Venous: 1.22 mmol/L (ref 0.5–1.9)

## 2018-01-05 MED ORDER — CIPROFLOXACIN IN D5W 400 MG/200ML IV SOLN
400.0000 mg | Freq: Once | INTRAVENOUS | Status: AC
  Administered 2018-01-05: 400 mg via INTRAVENOUS
  Filled 2018-01-05: qty 200

## 2018-01-05 MED ORDER — ONDANSETRON HCL 4 MG/2ML IJ SOLN
4.0000 mg | Freq: Once | INTRAMUSCULAR | Status: AC
  Administered 2018-01-05: 4 mg via INTRAVENOUS
  Filled 2018-01-05: qty 2

## 2018-01-05 MED ORDER — IOPAMIDOL (ISOVUE-300) INJECTION 61%
INTRAVENOUS | Status: AC
  Filled 2018-01-05: qty 100

## 2018-01-05 MED ORDER — METRONIDAZOLE IN NACL 5-0.79 MG/ML-% IV SOLN
500.0000 mg | Freq: Once | INTRAVENOUS | Status: AC
  Administered 2018-01-05: 500 mg via INTRAVENOUS
  Filled 2018-01-05: qty 100

## 2018-01-05 MED ORDER — OXYCODONE HCL 5 MG PO TABS
10.0000 mg | ORAL_TABLET | Freq: Once | ORAL | Status: AC
  Administered 2018-01-05: 10 mg via ORAL
  Filled 2018-01-05: qty 2

## 2018-01-05 MED ORDER — CIPROFLOXACIN HCL 500 MG PO TABS: 500.0000 mg | ORAL_TABLET | Freq: Two times a day (BID) | ORAL | 0 refills | Status: DC

## 2018-01-05 MED ORDER — MORPHINE SULFATE (PF) 4 MG/ML IV SOLN
4.0000 mg | Freq: Once | INTRAVENOUS | Status: AC
  Administered 2018-01-05: 4 mg via INTRAVENOUS
  Filled 2018-01-05: qty 1

## 2018-01-05 MED ORDER — IOPAMIDOL (ISOVUE-300) INJECTION 61%
100.0000 mL | Freq: Once | INTRAVENOUS | Status: AC | PRN
  Administered 2018-01-05: 100 mL via INTRAVENOUS

## 2018-01-05 MED ORDER — ONDANSETRON 4 MG PO TBDP: ORAL_TABLET | ORAL | 0 refills | Status: DC

## 2018-01-05 MED ORDER — METRONIDAZOLE 500 MG PO TABS: 500.0000 mg | ORAL_TABLET | Freq: Two times a day (BID) | ORAL | 0 refills | Status: DC

## 2018-01-05 MED ORDER — OXYCODONE HCL 5 MG PO TABS: 5.0000 mg | ORAL_TABLET | Freq: Four times a day (QID) | ORAL | 0 refills | Status: DC | PRN

## 2018-01-05 NOTE — ED Provider Notes (Signed)
[facility] EMERGENCY DEPARTMENT Provider Note   CSN: [id number] Arrival date & time: 01/04/18  2318     History   Chief Complaint Chief Complaint  Patient presents with  . Abdominal Pain    HPI Nicole Dixon is a 42 y.o. female with a hx of GERD, gastritis presents to the Emergency Department complaining of gradual, persistent, progressively worsening lower abdominal pain onset approximately 24 hours ago.  Patient reports the pain has worsened persistently since that time.  She reports pain is achy with intermittent sharp increases.  She states walking, riding in the car and movement make her pain significantly worse.  She reports severe nausea and anorexia but denies vomiting.  Last bowel movement was this morning and normal.  No melena or hematochezia.  She denies vaginal discharge, dysuria, hematuria.  She does not menstruate due to IUD placement approximately 5 years ago.  She denies previous abdominal surgeries.  Patient does report associated fever at home to 100.5.  Denies known sick contacts.  She does report travel to [place] August 3-10.  She reports that while she was there she had significant amounts of diarrhea which resolved after returning home.  She denies significant abdominal pain or fevers while traveling.   The history is provided by the patient, medical records and the spouse. No language interpreter was used.    Past Medical History:  Diagnosis Date  . Family history of breast cancer   . Gastritis     Patient Active Problem List   Diagnosis Date Noted  . Acromioclavicular sprain, right, initial encounter 12/18/2017  . Acute bursitis of left shoulder 10/11/2017  . Slipped rib syndrome 10/11/2017  . Nonallopathic lesion of rib cage 10/11/2017  . Nonallopathic lesion of cervical region 10/11/2017  . Nonallopathic lesion of thoracic region 10/11/2017  . Sprain of MCL (medial collateral ligament) of knee 05/19/2015  . History of anterior  cruciate ligament surgery 05/19/2015  . Genetic testing 11/27/2014  . Family history of breast cancer   . Popliteal bursitis of left knee 05/06/2014  . Hip abductor tendinitis 03/11/2014  . Piriformis syndrome of right side 03/11/2014  . Posterior tibial tendinitis 03/11/2014    Past Surgical History:  Procedure Laterality Date  . KNEE SURGERY Left      OB History   None      Home Medications    Prior to Admission medications   Medication Sig Start Date End Date Taking? Authorizing Provider  cholecalciferol (VITAMIN D) 1000 units tablet Take 2,000 Units by mouth daily.   Yes [provider]  levonorgestrel (MIRENA) 20 MCG/24HR IUD 1 each by Intrauterine route once.   Yes [provider]  Multiple Vitamin (MULTIVITAMIN WITH MINERALS) TABS tablet Take 1 tablet by mouth daily.   Yes [provider]  pantoprazole (PROTONIX) 40 MG tablet Take 40 mg by mouth 2 (two) times daily.   Yes [provider]  Probiotic Product (PROBIOTIC PO) Take 1 tablet by mouth daily.   Yes [provider]  ciprofloxacin (CIPRO) 500 MG tablet Take 1 tablet (500 mg total) by mouth 2 (two) times daily. One po bid x 7 days 01/05/18   Dael Howland, Jarrett Soho, PA-C  Diclofenac Sodium (PENNSAID) 2 % SOLN Place 2 g onto the skin 2 (two) times daily. Patient not taking: Reported on 01/05/2018 10/11/17   Lyndal Pulley, DO  metroNIDAZOLE (FLAGYL) 500 MG tablet Take 1 tablet (500 mg total) by mouth 2 (two) times daily. 01/05/18  Avriel Kandel, Jarrett Soho, PA-C  ondansetron (ZOFRAN ODT) 4 MG disintegrating tablet 19m ODT q4 hours prn nausea/vomit 01/05/18   Korin Setzler, HJarrett Soho PA-C  oxyCODONE (ROXICODONE) 5 MG immediate release tablet Take 1 tablet (5 mg total) by mouth every 6 (six) hours as needed for severe pain. 01/05/18   Declan Mier, HJarrett Soho PA-C    Family History Family History  Problem Relation Age of Onset  . Breast cancer Sister 40       PALB2+  . Breast cancer  Maternal Aunt 64  . Bladder Cancer Maternal Aunt 64  . Dementia Maternal Grandmother   . Breast cancer Paternal Grandmother        dx in her 665s . Breast cancer Cousin        dx in her late 428s BRCA-  . Cancer Cousin        leiomyosarcoma  . Breast cancer Other        MGMs sister  . Breast cancer Other        MGMs sister  . Cancer Other        PGFs twin brother with Cancer NOS    Social History Social History   Tobacco Use  . Smoking status: Never Smoker  . Smokeless tobacco: Never Used  Substance Use Topics  . Alcohol use: Yes    Alcohol/week: 0.0 standard drinks  . Drug use: Never     Allergies   Sulfa antibiotics and Penicillins   Review of Systems Review of Systems  Constitutional: Positive for appetite change and fever. Negative for diaphoresis, fatigue and unexpected weight change.  HENT: Negative for mouth sores.   Eyes: Negative for visual disturbance.  Respiratory: Negative for cough, chest tightness, shortness of breath and wheezing.   Cardiovascular: Negative for chest pain.  Gastrointestinal: Positive for abdominal pain and nausea. Negative for constipation, diarrhea and vomiting.  Endocrine: Negative for polydipsia, polyphagia and polyuria.  Genitourinary: Negative for dysuria, frequency, hematuria and urgency.  Musculoskeletal: Negative for back pain and neck stiffness.  Skin: Negative for rash.  Allergic/Immunologic: Negative for immunocompromised state.  Neurological: Negative for syncope, light-headedness and headaches.  Hematological: Does not bruise/bleed easily.  Psychiatric/Behavioral: Negative for sleep disturbance. The patient is not nervous/anxious.      Physical Exam Updated Vital Signs BP 125/79 (BP Location: Right Arm)   Pulse 96   Temp 99.5 F (37.5 C) (Oral)   Resp 14   Ht _0  (1.651 m)   Wt 54.4 kg   SpO2 100%   BMI 19.97 kg/m   Physical Exam  Constitutional: She appears well-developed and well-nourished. No  distress.  Awake, alert, nontoxic appearance uncomfortable appearing  HENT:  Head: Normocephalic and atraumatic.  Mouth/Throat: Oropharynx is clear and moist. No oropharyngeal exudate.  Eyes: Conjunctivae are normal. No scleral icterus.  Neck: Normal range of motion. Neck supple.  Cardiovascular: Normal rate, regular rhythm and intact distal pulses.  Pulmonary/Chest: Effort normal and breath sounds normal. No respiratory distress. She has no wheezes.  Equal chest expansion  Abdominal: Soft. She exhibits no distension and no mass. Bowel sounds are increased. There is no hepatosplenomegaly. There is tenderness in the right lower quadrant, periumbilical area, suprapubic area and left lower quadrant. There is rebound and guarding. There is no rigidity, no CVA tenderness, no tenderness at McBurney's point and negative Murphy's sign.  Musculoskeletal: Normal range of motion. She exhibits no edema.  Neurological: She is alert.  Speech is clear and goal oriented Moves extremities without ataxia  Skin: Skin  is warm and dry. She is not diaphoretic.  Psychiatric: She has a normal mood and affect.  Nursing note and vitals reviewed.    ED Treatments / Results  Labs (all labs ordered are listed, but only abnormal results are displayed) Labs Reviewed  COMPREHENSIVE METABOLIC PANEL - Abnormal; Notable for the following components:      Result Value   Potassium 5.4 (*)    Glucose, Bld 109 (*)    AST 64 (*)    Total Bilirubin 1.8 (*)    All other components within normal limits  CBC - Abnormal; Notable for the following components:   WBC 11.1 (*)    All other components within normal limits  LIPASE, BLOOD  URINALYSIS, ROUTINE W REFLEX MICROSCOPIC  I-STAT BETA HCG BLOOD, ED ([facility], [facility], [facility] ONLY)  I-STAT CG4 LACTIC ACID, ED  I-STAT CG4 LACTIC ACID, ED    EKG None  Radiology Ct Abdomen Pelvis W Contrast  Result Date: 01/05/2018 CLINICAL DATA:  Patient with lower abdominal pain. EXAM: CT  ABDOMEN AND PELVIS WITH CONTRAST TECHNIQUE: Multidetector CT imaging of the abdomen and pelvis was performed using the standard protocol following bolus administration of intravenous contrast. CONTRAST:  147m ISOVUE-300 IOPAMIDOL (ISOVUE-300) INJECTION 61% COMPARISON:  None. FINDINGS: Lower chest: Normal heart size. Dependent atelectasis within the bilateral lower lobes. No pleural effusion. Hepatobiliary: Liver is normal in size and contour. No focal hepatic lesion is identified. Gallbladder is unremarkable. No intrahepatic or extrahepatic biliary ductal dilatation. Pancreas: Unremarkable Spleen: Unremarkable Adrenals/Urinary Tract: Normal adrenal glands. Kidneys enhance symmetrically with contrast. No hydronephrosis. Urinary bladder is unremarkable. Stomach/Bowel: Focal circumferential wall thickening of the sigmoid colon (image 60; series 3). There is fat stranding within the adjacent colonic mesentery (image 41; series 6). No evidence for small bowel obstruction. Normal morphology of the stomach. No free intraperitoneal air. The appendix is not definitively identified. Vascular/Lymphatic: Normal caliber abdominal aorta. No retroperitoneal lymphadenopathy. Reproductive: Intrauterine device is present and may be posteriorly located within the uterus. Reflux of contrast into the gonadal veins bilaterally. Small amount of pelvic free fluid. Other: None. Musculoskeletal: No aggressive or acute appearing osseous lesions. IMPRESSION: 1. Focal area of eccentric wall thickening involving the sigmoid colon with adjacent mesenteric fat stranding concerning for sigmoid colonic diverticulitis. Given the eccentric appearance and adjacent mesenteric involvement, recommend further evaluation with colonoscopy after resolution of the acute symptomatology to exclude the possibility of underlying mass. 2. Nonspecific perihepatic fluid. 3. Intrauterine device is present and may potentially be malpositioned. Device appears located  posteriorly within the uterus. Consider further evaluation with pelvic ultrasound. Electronically Signed   By: DLovey NewcomerM.D.   On: 01/05/2018 02:28    Procedures Procedures (including critical care time)  Medications Ordered in ED Medications  morphine 4 MG/ML injection 4 mg (4 mg Intravenous Given 01/05/18 0033)  ondansetron (ZOFRAN) injection 4 mg (4 mg Intravenous Given 01/05/18 0034)  iopamidol (ISOVUE-300) 61 % injection 100 mL (100 mLs Intravenous Contrast Given 01/05/18 0145)  ciprofloxacin (CIPRO) IVPB 400 mg (0 mg Intravenous Stopped 01/05/18 0355)  metroNIDAZOLE (FLAGYL) IVPB 500 mg (0 mg Intravenous Stopped 01/05/18 0354)  oxyCODONE (Oxy IR/ROXICODONE) immediate release tablet 10 mg (10 mg Oral Given 01/05/18 0328)  ondansetron (ZOFRAN) injection 4 mg (4 mg Intravenous Given 01/05/18 0413)     Initial Impression / Assessment and Plan / ED Course  I have reviewed the triage vital signs and the nursing notes.  Pertinent labs & imaging results that were available during my care  of the patient were reviewed by me and considered in my medical decision making (see chart for details).  Clinical Course as of Jan 06 443  Sat Jan 05, 2018  0316 Pt reports improved nausea, pain persists   [HM]    Clinical Course User Index [HM] Nickolus Wadding, Gwenlyn Perking    Presents with lower abdominal pain.  Rebound and guarding on exam.  Show leukocytosis.  She is noted to have elevated AST and elevated potassium however I firmly believe that this was due to hemolysis.  Patient was given fluids.  CT scan shows diverticulitis without perforation or abscess.  I personally evaluated these images.  Of note, edema is significant and it is unable to be determined if there is an underlying mass.  I have had a long discussion with the patient about potential for underlying mass as a potential cause of her diverticulitis.  She will need close gastroenterology follow-up and likely colonoscopy.  Patient reports  she does have a gastroenterology appointment scheduled in 3 weeks.  I encouraged her to keep this.  CT scan also notes posterior lying IUD.  Patient reports she has an appointment in several days to have this removed.  She will follow with her OB/GYN.  Given IV antibiotics and pain control here in the emergency department.  She is feeling much better.  No vomiting here.  Patient will have close follow-up with OB/GYN and gastroenterology.  Discussed reasons to return immediately to the emergency department.  Patient and husband state understanding and are in agreement with the plan.  Final Clinical Impressions(s) / ED Diagnoses   Final diagnoses:  Diverticulitis    ED Discharge Orders         Ordered    ciprofloxacin (CIPRO) 500 MG tablet  2 times daily     01/05/18 0435    metroNIDAZOLE (FLAGYL) 500 MG tablet  2 times daily     01/05/18 0435    ondansetron (ZOFRAN ODT) 4 MG disintegrating tablet     01/05/18 0435    oxyCODONE (ROXICODONE) 5 MG immediate release tablet  Every 6 hours PRN     01/05/18 0435           Taimi Towe, Jarrett Soho, PA-C 01/05/18 0444    Orpah Greek, MD 01/05/18 [phone]

## 2018-01-05 NOTE — Discharge Instructions (Addendum)
1. Medications: zofran, oxycodone for breakthrough pain, Cipro, Flagyl, usual home medications 2. Treatment: rest, drink plenty of fluids, advance diet slowly 3. Follow Up: Please followup with your primary doctor in 2 days for discussion of your diagnoses and further evaluation after today's visit; if you do not have a primary care doctor use the resource guide provided to find one; Please return to the ER for persistent vomiting, high fevers or worsening symptoms

## 2018-01-07 MED FILL — PANTOPRAZOLE SOD DR 40 MG T: 40 | 45 days supply | Qty: 90 | Fill #0

## 2018-01-10 DIAGNOSIS — K5792 Diverticulitis of intestine, part unspecified, without perforation or abscess without bleeding: Secondary | ICD-10-CM | POA: Diagnosis not present

## 2018-01-16 ENCOUNTER — Ambulatory Visit: Payer: 59 | Admitting: Family Medicine

## 2018-03-05 DIAGNOSIS — Z01419 Encounter for gynecological examination (general) (routine) without abnormal findings: Secondary | ICD-10-CM | POA: Diagnosis not present

## 2018-03-05 DIAGNOSIS — Z124 Encounter for screening for malignant neoplasm of cervix: Secondary | ICD-10-CM | POA: Diagnosis not present

## 2018-03-05 DIAGNOSIS — Z30433 Encounter for removal and reinsertion of intrauterine contraceptive device: Secondary | ICD-10-CM | POA: Diagnosis not present

## 2018-03-05 MED FILL — PANTOPRAZOLE SOD DR 40 MG T: 40 | 45 days supply | Qty: 90 | Fill #0

## 2018-03-14 ENCOUNTER — Ambulatory Visit (INDEPENDENT_AMBULATORY_CARE_PROVIDER_SITE_OTHER): Payer: 59 | Admitting: Family Medicine

## 2018-03-14 ENCOUNTER — Encounter: Payer: Self-pay | Admitting: Family Medicine

## 2018-03-14 DIAGNOSIS — S4351XA Sprain of right acromioclavicular joint, initial encounter: Secondary | ICD-10-CM

## 2018-03-14 NOTE — Progress Notes (Signed)
[facility] [place], [place] [address] Phone: [phone] Subjective:    I Nicole Dixon am serving as a Education administrator for Dr. Hulan Saas.     CC: Right shoulder pain  [id number]  Nicole Dixon is a 42 y.o. female coming in with complaint of right shoulder pain. Deformity at the [facility] joint. Achy pain with numbness to the finger tips. 2 nights ago it was very achy. Has been using Pennsaid for pain. Deformity has been getting bigger.  Patient was seen previously and given an injection December 18, 2017.     Past Medical History:  Diagnosis Date  . Family history of breast cancer   . Gastritis    Past Surgical History:  Procedure Laterality Date  . KNEE SURGERY Left    Social History   Socioeconomic History  . Marital status: Married    Spouse name: Not on file  . Number of children: 4  . Years of education: Not on file  . Highest education level: Not on file  Occupational History  . Not on file  Social Needs  . Financial resource strain: Not on file  . Food insecurity:    Worry: Not on file    Inability: Not on file  . Transportation needs:    Medical: Not on file    Non-medical: Not on file  Tobacco Use  . Smoking status: Never Smoker  . Smokeless tobacco: Never Used  Substance and Sexual Activity  . Alcohol use: Yes    Alcohol/week: 0.0 standard drinks  . Drug use: Never  . Sexual activity: Not on file  Lifestyle  . Physical activity:    Days per week: Not on file    Minutes per session: Not on file  . Stress: Not on file  Relationships  . Social connections:    Talks on phone: Not on file    Gets together: Not on file    Attends religious service: Not on file    Active member of club or organization: Not on file    Attends meetings of clubs or organizations: Not on file    Relationship status: Not on file  Other Topics Concern  . Not on file  Social History Narrative  . Not on file   Allergies    Allergen Reactions  . Sulfa Antibiotics Shortness Of Breath and Rash  . Penicillins Rash   Family History  Problem Relation Age of Onset  . Breast cancer Sister 40       PALB2+  . Breast cancer Maternal Aunt 64  . Bladder Cancer Maternal Aunt 64  . Dementia Maternal Grandmother   . Breast cancer Paternal Grandmother        dx in her 24s  . Breast cancer Cousin        dx in her late 36s; BRCA-  . Cancer Cousin        leiomyosarcoma  . Breast cancer Other        MGMs sister  . Breast cancer Other        MGMs sister  . Cancer Other        PGFs twin brother with Cancer NOS    Current Outpatient Medications (Endocrine & Metabolic):  .  levonorgestrel (MIRENA) 20 MCG/24HR IUD, 1 each by Intrauterine route once.    Current Outpatient Medications (Analgesics):  .  oxyCODONE (ROXICODONE) 5 MG immediate release tablet, Take 1 tablet (5 mg total) by mouth every 6 (six)  hours as needed for severe pain.   Current Outpatient Medications (Other):  .  cholecalciferol (VITAMIN D) 1000 units tablet, Take 2,000 Units by mouth daily. .  ciprofloxacin (CIPRO) 500 MG tablet, Take 1 tablet (500 mg total) by mouth 2 (two) times daily. One po bid x 7 days .  Diclofenac Sodium (PENNSAID) 2 % SOLN, Place 2 g onto the skin 2 (two) times daily. .  metroNIDAZOLE (FLAGYL) 500 MG tablet, Take 1 tablet (500 mg total) by mouth 2 (two) times daily. .  Multiple Vitamin (MULTIVITAMIN WITH MINERALS) TABS tablet, Take 1 tablet by mouth daily. .  ondansetron (ZOFRAN ODT) 4 MG disintegrating tablet, 74m ODT q4 hours prn nausea/vomit .  pantoprazole (PROTONIX) 40 MG tablet, Take 40 mg by mouth 2 (two) times daily. .  Probiotic Product (PROBIOTIC PO), Take 1 tablet by mouth daily.    Past medical history, social, surgical and family history all reviewed in electronic medical record.  No pertanent information unless stated regarding to the chief complaint.   Review of Systems:  No headache, visual changes,  nausea, vomiting, diarrhea, constipation, dizziness, abdominal pain, skin rash, fevers, chills, night sweats, weight loss, swollen lymph nodes, body aches, joint swelling, muscle aches, chest pain, shortness of breath, mood changes.   Objective  Blood pressure 112/60, pulse 66, height _0  (1.651 m), weight 116 lb (52.6 kg), SpO2 98 %.  General: No apparent distress alert and oriented x3 mood and affect normal, dressed appropriately.  HEENT: Pupils equal, extraocular movements intact  Respiratory: Patient's speak in full sentences and does not appear short of breath  Cardiovascular: No lower extremity edema, non tender, no erythema  Skin: Warm dry intact with no signs of infection or rash on extremities or on axial skeleton.  Abdomen: Soft nontender  Neuro: Cranial nerves II through XII are intact, neurovascularly intact in all extremities with 2+ DTRs and 2+ pulses.  Lymph: No lymphadenopathy of posterior or anterior cervical chain or axillae bilaterally.  Gait normal with good balance and coordination.  MSK:  Non tender with full range of motion and good stability and symmetric strength and tone of  elbows, wrist, hip, knee and ankles bilaterally.  Right shoulder exam on inspection does show the patient's acromial clavicular joint seems to be protruding.  Compared to the contralateral side though it seems like there is atrophy of the musculature surrounding the area.  Near full range of motion.  Nontender on exam.    Impression and Recommendations:     This case required medical decision making of moderate complexity. The above documentation has been reviewed and is accurate and complete ZLyndal Pulley DO       Note: This dictation was prepared with Dragon dictation along with smaller phrase technology. Any transcriptional errors that result from this process are unintentional.

## 2018-03-14 NOTE — Assessment & Plan Note (Signed)
Patient has had swelling previously.  I do believe the patient has more of an atrophy of the surrounding musculature.  We discussed the possibility of x-rays to further evaluate.  Patient wants to hold off on that.  Patient has no fevers, chills, any abnormal weight loss.  Discussed which activities to do which wants to avoid.  We will check again in 2 months to see how patient is doing.

## 2018-03-14 NOTE — Patient Instructions (Signed)
Sorry I removed the fat around the [facility] joint with the steroid injection.  Lets watch it and see what happens Try not to put a lot of weight in the area Continue the vitamin D  Ice when needed  K2 at [employer] daily for 1 months  Call [phone] when you need [place]  Maybe have an appointment in 6 weeks just in case

## 2018-04-25 NOTE — Progress Notes (Deleted)
[name] [place], [place] [address] Phone: [phone] Subjective:    I'm seeing this patient by the request  of:    CC:   [id number]  Nicole Dixon is a 42 y.o. female coming in with complaint of ***  Onset-  Location Duration-  Character- Aggravating factors- Reliving factors-  Therapies tried-  Severity-     Past Medical History:  Diagnosis Date  . Family history of breast cancer   . Gastritis    Past Surgical History:  Procedure Laterality Date  . KNEE SURGERY Left    Social History   Socioeconomic History  . Marital status: Married    Spouse name: Not on file  . Number of children: 4  . Years of education: Not on file  . Highest education level: Not on file  Occupational History  . Not on file  Social Needs  . Financial resource strain: Not on file  . Food insecurity:    Worry: Not on file    Inability: Not on file  . Transportation needs:    Medical: Not on file    Non-medical: Not on file  Tobacco Use  . Smoking status: Never Smoker  . Smokeless tobacco: Never Used  Substance and Sexual Activity  . Alcohol use: Yes    Alcohol/week: 0.0 standard drinks  . Drug use: Never  . Sexual activity: Not on file  Lifestyle  . Physical activity:    Days per week: Not on file    Minutes per session: Not on file  . Stress: Not on file  Relationships  . Social connections:    Talks on phone: Not on file    Gets together: Not on file    Attends religious service: Not on file    Active member of club or organization: Not on file    Attends meetings of clubs or organizations: Not on file    Relationship status: Not on file  Other Topics Concern  . Not on file  Social History Narrative  . Not on file   Allergies  Allergen Reactions  . Sulfa Antibiotics Shortness Of Breath and Rash  . Penicillins Rash   Family History  Problem Relation Age of Onset  . Breast cancer Sister 40       PALB2+    . Breast cancer Maternal Aunt 64  . Bladder Cancer Maternal Aunt 64  . Dementia Maternal Grandmother   . Breast cancer Paternal Grandmother        dx in her 65s  . Breast cancer Cousin        dx in her late 69s; BRCA-  . Cancer Cousin        leiomyosarcoma  . Breast cancer Other        MGMs sister  . Breast cancer Other        MGMs sister  . Cancer Other        PGFs twin brother with Cancer NOS    Current Outpatient Medications (Endocrine & Metabolic):  .  levonorgestrel (MIRENA) 20 MCG/24HR IUD, 1 each by Intrauterine route once.    Current Outpatient Medications (Analgesics):  .  oxyCODONE (ROXICODONE) 5 MG immediate release tablet, Take 1 tablet (5 mg total) by mouth every 6 (six) hours as needed for severe pain.   Current Outpatient Medications (Other):  .  cholecalciferol (VITAMIN D) 1000 units tablet, Take 2,000 Units by mouth daily. .  ciprofloxacin (CIPRO) 500 MG tablet, Take  1 tablet (500 mg total) by mouth 2 (two) times daily. One po bid x 7 days .  Diclofenac Sodium (PENNSAID) 2 % SOLN, Place 2 g onto the skin 2 (two) times daily. .  metroNIDAZOLE (FLAGYL) 500 MG tablet, Take 1 tablet (500 mg total) by mouth 2 (two) times daily. .  Multiple Vitamin (MULTIVITAMIN WITH MINERALS) TABS tablet, Take 1 tablet by mouth daily. .  ondansetron (ZOFRAN ODT) 4 MG disintegrating tablet, 13m ODT q4 hours prn nausea/vomit .  pantoprazole (PROTONIX) 40 MG tablet, Take 40 mg by mouth 2 (two) times daily. .  Probiotic Product (PROBIOTIC PO), Take 1 tablet by mouth daily.    Past medical history, social, surgical and family history all reviewed in electronic medical record.  No pertanent information unless stated regarding to the chief complaint.   Review of Systems:  No headache, visual changes, nausea, vomiting, diarrhea, constipation, dizziness, abdominal pain, skin rash, fevers, chills, night sweats, weight loss, swollen lymph nodes, body aches, joint swelling, muscle aches,  chest pain, shortness of breath, mood changes.   Objective  There were no vitals taken for this visit. Systems examined below as of    General: No apparent distress alert and oriented x3 mood and affect normal, dressed appropriately.  HEENT: Pupils equal, extraocular movements intact  Respiratory: Patient's speak in full sentences and does not appear short of breath  Cardiovascular: No lower extremity edema, non tender, no erythema  Skin: Warm dry intact with no signs of infection or rash on extremities or on axial skeleton.  Abdomen: Soft nontender  Neuro: Cranial nerves II through XII are intact, neurovascularly intact in all extremities with 2+ DTRs and 2+ pulses.  Lymph: No lymphadenopathy of posterior or anterior cervical chain or axillae bilaterally.  Gait normal with good balance and coordination.  MSK:  Non tender with full range of motion and good stability and symmetric strength and tone of shoulders, elbows, wrist, hip, knee and ankles bilaterally.     Impression and Recommendations:     This case required medical decision making of moderate complexity. The above documentation has been reviewed and is accurate and complete ZLyndal Pulley DO       Note: This dictation was prepared with Dragon dictation along with smaller phrase technology. Any transcriptional errors that result from this process are unintentional.

## 2018-04-26 ENCOUNTER — Ambulatory Visit: Payer: 59 | Admitting: Family Medicine

## 2018-05-30 MED FILL — PANTOPRAZOLE SOD DR 40 MG T: 40 | 45 days supply | Qty: 90 | Fill #0

## 2018-09-09 MED FILL — PANTOPRAZOLE SOD DR 40 MG T: 40 | 45 days supply | Qty: 90 | Fill #1

## 2018-12-02 MED FILL — PANTOPRAZOLE SOD DR 40 MG T: 40 | 90 days supply | Qty: 90 | Fill #0

## 2018-12-10 MED FILL — PANTOPRAZOLE SOD DR 40 MG T: 40 | 90 days supply | Qty: 90 | Fill #0

## 2019-02-04 NOTE — Progress Notes (Signed)
[name] [place], [place] [address] Phone: [phone] Subjective:   I Nicole Dixon am serving as a Education administrator for Dr. Hulan Saas.   CC: Right hamstring and buttocks pain  [id number]   03/14/2018 Patient has had swelling previously.  I do believe the patient has more of an atrophy of the surrounding musculature.  We discussed the possibility of x-rays to further evaluate.  Patient wants to hold off on that.  Patient has no fevers, chills, any abnormal weight loss.  Discussed which activities to do which wants to avoid.  We will check again in 2 months to see how patient is doing.  02/05/2019 Nicole Dixon is a 43 y.o. female coming in with complaint of right hip pain. States that the glut muscle is tight. States she still runs and that she ran today. States the hip goes numb or stops hurting after she warms up. Has been biking at the gym. Right shoulder pain has reoccurred. More concerned about the hip today.  Onset- July  Location - glut./piriformis  Duration-  Character- achy, sore  Aggravating factors- running especially uphill Reliving factors-none decreasing running Therapies tried- ice, pennsaid  Severity-5 out of 10     Past Medical History:  Diagnosis Date  . Family history of breast cancer   . Gastritis    Past Surgical History:  Procedure Laterality Date  . KNEE SURGERY Left    Social History   Socioeconomic History  . Marital status: Married    Spouse name: Not on file  . Number of children: 4  . Years of education: Not on file  . Highest education level: Not on file  Occupational History  . Not on file  Social Needs  . Financial resource strain: Not on file  . Food insecurity    Worry: Not on file    Inability: Not on file  . Transportation needs    Medical: Not on file    Non-medical: Not on file  Tobacco Use  . Smoking status: Never Smoker  . Smokeless tobacco: Never Used  Substance and  Sexual Activity  . Alcohol use: Yes    Alcohol/week: 0.0 standard drinks  . Drug use: Never  . Sexual activity: Not on file  Lifestyle  . Physical activity    Days per week: Not on file    Minutes per session: Not on file  . Stress: Not on file  Relationships  . Social Herbalist on phone: Not on file    Gets together: Not on file    Attends religious service: Not on file    Active member of club or organization: Not on file    Attends meetings of clubs or organizations: Not on file    Relationship status: Not on file  Other Topics Concern  . Not on file  Social History Narrative  . Not on file   Allergies  Allergen Reactions  . Sulfa Antibiotics Shortness Of Breath and Rash  . Penicillins Rash   Family History  Problem Relation Age of Onset  . Breast cancer Sister 40       PALB2+  . Breast cancer Maternal Aunt 64  . Bladder Cancer Maternal Aunt 64  . Dementia Maternal Grandmother   . Breast cancer Paternal Grandmother        dx in her 45s  . Breast cancer Cousin        dx in her late 50s; BRCA-  .  Cancer Cousin        leiomyosarcoma  . Breast cancer Other        MGMs sister  . Breast cancer Other        MGMs sister  . Cancer Other        PGFs twin brother with Cancer NOS    Current Outpatient Medications (Endocrine & Metabolic):  .  levonorgestrel (MIRENA) 20 MCG/24HR IUD, 1 each by Intrauterine route once.    Current Outpatient Medications (Analgesics):  .  oxyCODONE (ROXICODONE) 5 MG immediate release tablet, Take 1 tablet (5 mg total) by mouth every 6 (six) hours as needed for severe pain.   Current Outpatient Medications (Other):  .  cholecalciferol (VITAMIN D) 1000 units tablet, Take 2,000 Units by mouth daily. .  ciprofloxacin (CIPRO) 500 MG tablet, Take 1 tablet (500 mg total) by mouth 2 (two) times daily. One po bid x 7 days .  Diclofenac Sodium (PENNSAID) 2 % SOLN, Place 2 g onto the skin 2 (two) times daily. .  metroNIDAZOLE (FLAGYL)  500 MG tablet, Take 1 tablet (500 mg total) by mouth 2 (two) times daily. .  Multiple Vitamin (MULTIVITAMIN WITH MINERALS) TABS tablet, Take 1 tablet by mouth daily. .  ondansetron (ZOFRAN ODT) 4 MG disintegrating tablet, 58m ODT q4 hours prn nausea/vomit .  pantoprazole (PROTONIX) 40 MG tablet, Take 40 mg by mouth 2 (two) times daily. .  Probiotic Product (PROBIOTIC PO), Take 1 tablet by mouth daily.    Past medical history, social, surgical and family history all reviewed in electronic medical record.  No pertanent information unless stated regarding to the chief complaint.   Review of Systems:  No headache, visual changes, nausea, vomiting, diarrhea, constipation, dizziness, abdominal pain, skin rash, fevers, chills, night sweats, weight loss, swollen lymph nodes, body aches, joint swelling, chest pain, shortness of breath, mood changes.  Positive muscle aches  Objective  Blood pressure 106/80, pulse 66, height 5' 5"  (1.651 m), SpO2 98 %.    General: No apparent distress alert and oriented x3 mood and affect normal, dressed appropriately.  HEENT: Pupils equal, extraocular movements intact  Respiratory: Patient's speak in full sentences and does not appear short of breath  Cardiovascular: No lower extremity edema, non tender, no erythema  Skin: Warm dry intact with no signs of infection or rash on extremities or on axial skeleton.  Abdomen: Soft nontender  Neuro: Cranial nerves II through XII are intact, neurovascularly intact in all extremities with 2+ DTRs and 2+ pulses.  Lymph: No lymphadenopathy of posterior or anterior cervical chain or axillae bilaterally.  Gait normal with good balance and coordination.  MSK:  Non tender with full range of motion and good stability and symmetric strength and tone of shoulders, elbows, wrist, hip, knee and ankles bilaterally.   Right leg pain does have some tenderness to palpation at the origin of the hamstring.  Tenderness with straight leg test.   Mild pain with FCorky Sox  Nontender in the paraspinal musculature lumbar spine   Impression and Recommendations:     This case required medical decision making of moderate complexity. The above documentation has been reviewed and is accurate and complete ZLyndal Pulley DO       Note: This dictation was prepared with Dragon dictation along with smaller phrase technology. Any transcriptional errors that result from this process are unintentional.

## 2019-02-05 ENCOUNTER — Encounter: Payer: Self-pay | Admitting: Family Medicine

## 2019-02-05 ENCOUNTER — Other Ambulatory Visit: Payer: Self-pay

## 2019-02-05 ENCOUNTER — Ambulatory Visit: Payer: 59 | Admitting: Family Medicine

## 2019-02-05 DIAGNOSIS — S76301A Unspecified injury of muscle, fascia and tendon of the posterior muscle group at thigh level, right thigh, initial encounter: Secondary | ICD-10-CM | POA: Diagnosis not present

## 2019-02-05 NOTE — Patient Instructions (Signed)
Thigh compression sleeve Rather do bike and elliptical Treadmill flat is ok See me again in 6 weeks if not better we can consider injection or PT

## 2019-02-05 NOTE — Assessment & Plan Note (Signed)
Discussed HEP Discussed thigh compression  No running hills, decrease stride length  RTC in 4-8 weeks

## 2019-03-11 MED FILL — PANTOPRAZOLE SOD DR 40 MG T: 40 | 90 days supply | Qty: 90 | Fill #1

## 2019-03-24 ENCOUNTER — Ambulatory Visit: Payer: No Typology Code available for payment source | Admitting: Family Medicine

## 2019-06-19 MED FILL — PANTOPRAZOLE SOD DR 40 MG T: 40 | 90 days supply | Qty: 90 | Fill #2

## 2019-08-06 ENCOUNTER — Encounter: Payer: Self-pay | Admitting: Family Medicine

## 2019-08-06 ENCOUNTER — Ambulatory Visit (INDEPENDENT_AMBULATORY_CARE_PROVIDER_SITE_OTHER): Payer: No Typology Code available for payment source

## 2019-08-06 ENCOUNTER — Other Ambulatory Visit: Payer: Self-pay

## 2019-08-06 ENCOUNTER — Ambulatory Visit: Payer: No Typology Code available for payment source | Admitting: Family Medicine

## 2019-08-06 VITALS — BP 122/88 | HR 80 | Ht 65.0 in | Wt 116.0 lb

## 2019-08-06 DIAGNOSIS — M79671 Pain in right foot: Secondary | ICD-10-CM

## 2019-08-06 DIAGNOSIS — M25571 Pain in right ankle and joints of right foot: Secondary | ICD-10-CM

## 2019-08-06 DIAGNOSIS — M25572 Pain in left ankle and joints of left foot: Secondary | ICD-10-CM

## 2019-08-06 MED ORDER — GABAPENTIN 100 MG PO CAPS: 100.0000 mg | ORAL_CAPSULE | Freq: Every day | ORAL | 0 refills | Status: DC

## 2019-08-06 MED ORDER — VITAMIN D (ERGOCALCIFEROL) 1.25 MG (50000 UNIT) PO CAPS: 50000.0000 [IU] | ORAL_CAPSULE | ORAL | 0 refills | Status: DC

## 2019-08-06 NOTE — Assessment & Plan Note (Signed)
Patient does have medial ankle pain bilaterally.  Concern for potential navicular stress reaction.  No cortical irregularity noted.  Questionable tibial nerve calcific changes.  Discussed gabapentin and low-dose, icing regimen, home exercise, home exercises given.  Follow-up again in 4 weeks

## 2019-08-06 NOTE — Patient Instructions (Signed)
Gabapentin 100 Go down to 2-3 miles running Once weekly Vit D Heel lift Pennsaid See me in 4 weeks

## 2019-08-06 NOTE — Progress Notes (Signed)
[place] Phone: [phone] Subjective:   [name], am serving as a scribe for Dr. Hulan Saas. This visit occurred during the SARS-CoV-2 public health emergency.  Safety protocols were in place, including screening questions prior to the visit, additional usage of staff PPE, and extensive cleaning of exam room while observing appropriate contact time as indicated for disinfecting solutions.    I'm seeing this patient by the request  of:  Patient, No Pcp Per  CC: Bilateral ankle pain  [id number]  Nicole Dixon is a 44 y.o. female coming in with complaint of right foot pain. Last seen in 01/2019 for hamstring injury. Patient states that she has been running in [place] [employer] for a while. Got some new shoes in December. Was having tightness in achilles and into the medial longitudinal arch along tib posterior. Typically runs on treadmill. Pain decreased when patient started to bike instead. Went back to [employer] 2020 instead of 2021's. Patient was at work and was pushing with her foot on a stretcher that was stuck and feels that her pain returned. Has been icing and doing some exercises. Has had similar problems in the past. Also having pain on the left foot but worse on the right.       Past Medical History:  Diagnosis Date  . Family history of breast cancer   . Gastritis    Past Surgical History:  Procedure Laterality Date  . KNEE SURGERY Left    Social History   Socioeconomic History  . Marital status: Married    Spouse name: Not on file  . Number of children: 4  . Years of education: Not on file  . Highest education level: Not on file  Occupational History  . Not on file  Tobacco Use  . Smoking status: Never Smoker  . Smokeless tobacco: Never Used  Substance and Sexual Activity  . Alcohol use: Yes    Alcohol/week: 0.0 standard drinks  . Drug use: Never  . Sexual  activity: Not on file  Other Topics Concern  . Not on file  Social History Narrative  . Not on file   Social Determinants of Health   Financial Resource Strain:   . Difficulty of Paying Living Expenses:   Food Insecurity:   . Worried About Charity fundraiser in the Last Year:   . Arboriculturist in the Last Year:   Transportation Needs:   . Film/video editor (Medical):   Marland Kitchen Lack of Transportation (Non-Medical):   Physical Activity:   . Days of Exercise per Week:   . Minutes of Exercise per Session:   Stress:   . Feeling of Stress :   Social Connections:   . Frequency of Communication with Friends and Family:   . Frequency of Social Gatherings with Friends and Family:   . Attends Religious Services:   . Active Member of Clubs or Organizations:   . Attends Archivist Meetings:   Marland Kitchen Marital Status:    Allergies  Allergen Reactions  . Sulfa Antibiotics Shortness Of Breath and Rash  . Penicillins Rash   Family History  Problem Relation Age of Onset  . Breast cancer Sister 40       PALB2+  . Breast cancer Maternal Aunt 64  . Bladder Cancer Maternal Aunt 64  . Dementia Maternal Grandmother   . Breast cancer Paternal Grandmother  dx in her 33s  . Breast cancer Cousin        dx in her late 33s; BRCA-  . Cancer Cousin        leiomyosarcoma  . Breast cancer Other        MGMs sister  . Breast cancer Other        MGMs sister  . Cancer Other        PGFs twin brother with Cancer NOS    Current Outpatient Medications (Endocrine & Metabolic):  .  levonorgestrel (MIRENA) 20 MCG/24HR IUD, 1 each by Intrauterine route once.    Current Outpatient Medications (Analgesics):  .  oxyCODONE (ROXICODONE) 5 MG immediate release tablet, Take 1 tablet (5 mg total) by mouth every 6 (six) hours as needed for severe pain.   Current Outpatient Medications (Other):  .  cholecalciferol (VITAMIN D) 1000 units tablet, Take 2,000 Units by mouth daily. .  ciprofloxacin  (CIPRO) 500 MG tablet, Take 1 tablet (500 mg total) by mouth 2 (two) times daily. One po bid x 7 days .  Diclofenac Sodium (PENNSAID) 2 % SOLN, Place 2 g onto the skin 2 (two) times daily. Marland Kitchen  gabapentin (NEURONTIN) 100 MG capsule, Take 1 capsule (100 mg total) by mouth at bedtime. .  metroNIDAZOLE (FLAGYL) 500 MG tablet, Take 1 tablet (500 mg total) by mouth 2 (two) times daily. .  Multiple Vitamin (MULTIVITAMIN WITH MINERALS) TABS tablet, Take 1 tablet by mouth daily. .  ondansetron (ZOFRAN ODT) 4 MG disintegrating tablet, 15m ODT q4 hours prn nausea/vomit .  pantoprazole (PROTONIX) 40 MG tablet, Take 40 mg by mouth 2 (two) times daily. .  Probiotic Product (PROBIOTIC PO), Take 1 tablet by mouth daily. .  Vitamin D, Ergocalciferol, (DRISDOL) 1.25 MG (50000 UNIT) CAPS capsule, Take 1 capsule (50,000 Units total) by mouth every 7 (seven) days.   Reviewed prior external information including notes and imaging from  primary care provider As well as notes that were available from care everywhere and other healthcare systems.  Past medical history, social, surgical and family history all reviewed in electronic medical record.  No pertanent information unless stated regarding to the chief complaint.   Review of Systems:  No headache, visual changes, nausea, vomiting, diarrhea, constipation, dizziness, abdominal pain, skin rash, fevers, chills, night sweats, weight loss, swollen lymph nodes, body aches, joint swelling, chest pain, shortness of breath, mood changes. POSITIVE muscle aches  Objective  Blood pressure 122/88, pulse 80, height 5' 5" (1.651 m), weight 116 lb (52.6 kg), SpO2 99 %.   General: No apparent distress alert and oriented x3 mood and affect normal, dressed appropriately.  HEENT: Pupils equal, extraocular movements intact  Respiratory: Patient's speak in full sentences and does not appear short of breath  Cardiovascular: No lower extremity edema, non tender, no erythema  Skin:  Warm dry intact with no signs of infection or rash on extremities or on axial skeleton.  Abdomen: Soft nontender  Neuro: Cranial nerves II through XII are intact, neurovascularly intact in all extremities with 2+ DTRs and 2+ pulses.  Lymph: No lymphadenopathy of posterior or anterior cervical chain or axillae bilaterally.  Gait normal with good balance and coordination.  MSK:  Non tender with full range of motion and good stability and symmetric strength and tone of shoulders, elbows, wrist, hip, knees bilaterally.  Bilateral ankle exam shows the patient does have some mild tenderness over the posterior tibialis.  Patient does have a positive tarsal tunnel.  Full range  of motion noted.  5-5 strength of ankles bilaterally.  Limited musculoskeletal ultrasound was performed and interpreted by Lyndal Pulley  Limited ultrasound angles patient does have some mild calcific changes of the posterior tibialis nerve near the tarsal tunnel area than true tendinitis.  No joint effusion noted.  No cortical irregularity of the navicular bone but does have some increased Doppler flow of the navicular prominence   Impression and Recommendations:     This case required medical decision making of moderate complexity. The above documentation has been reviewed and is accurate and complete Lyndal Pulley, DO       Note: This dictation was prepared with Dragon dictation along with smaller phrase technology. Any transcriptional errors that result from this process are unintentional.

## 2019-08-26 ENCOUNTER — Other Ambulatory Visit: Payer: Self-pay | Admitting: Family Medicine

## 2019-09-04 ENCOUNTER — Ambulatory Visit: Payer: No Typology Code available for payment source | Admitting: Family Medicine

## 2019-09-18 MED FILL — PANTOPRAZOLE SOD DR 40 MG T: 40 | 90 days supply | Qty: 90 | Fill #3

## 2019-10-03 ENCOUNTER — Ambulatory Visit: Payer: No Typology Code available for payment source | Admitting: Family Medicine

## 2019-10-28 ENCOUNTER — Other Ambulatory Visit (HOSPITAL_COMMUNITY): Payer: Self-pay | Admitting: Obstetrics and Gynecology

## 2019-11-17 ENCOUNTER — Other Ambulatory Visit: Payer: Self-pay | Admitting: Family Medicine

## 2019-12-22 MED FILL — PANTOPRAZOLE SOD DR 40 MG T: 40 | 90 days supply | Qty: 90 | Fill #0

## 2020-01-06 ENCOUNTER — Encounter: Payer: Self-pay | Admitting: Family Medicine

## 2020-01-06 ENCOUNTER — Ambulatory Visit: Payer: No Typology Code available for payment source | Admitting: Family Medicine

## 2020-01-06 ENCOUNTER — Other Ambulatory Visit: Payer: Self-pay

## 2020-01-06 VITALS — BP 122/70 | HR 99 | Ht 65.0 in | Wt 116.0 lb

## 2020-01-06 DIAGNOSIS — M222X2 Patellofemoral disorders, left knee: Secondary | ICD-10-CM

## 2020-01-06 DIAGNOSIS — M940 Chondrocostal junction syndrome [Tietze]: Secondary | ICD-10-CM

## 2020-01-06 DIAGNOSIS — M999 Biomechanical lesion, unspecified: Secondary | ICD-10-CM

## 2020-01-06 MED ORDER — TIZANIDINE HCL 4 MG PO TABS: 4.0000 mg | ORAL_TABLET | Freq: Every day | ORAL | 0 refills | Status: DC

## 2020-01-06 MED ORDER — MELOXICAM 7.5 MG PO TABS: 7.5000 mg | ORAL_TABLET | Freq: Every day | ORAL | 0 refills | Status: DC

## 2020-01-06 NOTE — Assessment & Plan Note (Signed)
Chronic problem with mild exacerbation.  Patient also having worsening of a piriformis syndrome.  Responded fairly well to osteopathic manipulation.  Discussed meloxicam and a muscle relaxer at night.  We discussed the potential for gabapentin which patient is not taking regularly as well.  Given mild home exercises and follow-up again in 4 to 8 weeks

## 2020-01-06 NOTE — Progress Notes (Signed)
[place] Phone: [phone] Subjective:   [name], am serving as a scribe for Dr. Hulan Saas. This visit occurred during the SARS-CoV-2 public health emergency.  Safety protocols were in place, including screening questions prior to the visit, additional usage of staff PPE, and extensive cleaning of exam room while observing appropriate contact time as indicated for disinfecting solutions.   I'm seeing this patient by the request  of:  Patient, No Pcp Per  CC: Back and neck pain follow-up  [id number]  ANELIZ CARBARY is a 44 y.o. female coming in with complaint of back and neck pain. Last seen for foot/ankle pain in March 2021. Patient states that she woke up last week and her hips were crooked. Notes soreness in right SI joint pain.  States that she has had a lot of stress recently and thinks it could be contributing does not remember any true injury, but is running maybe a little bit more and did have to do repetitive lifting with moving her son to college.  Patient has been very stressed and is also having tightness in upper back as well. No history of back and neck pain.   Medications patient has been prescribed:  Nothing specifically but would take something now         Reviewed prior external information including notes and imaging from previsou exam, outside providers and external EMR if available.   As well as notes that were available from care everywhere and other healthcare systems.  Past medical history, social, surgical and family history all reviewed in electronic medical record.  No pertanent information unless stated regarding to the chief complaint.   Past Medical History:  Diagnosis Date  . Family history of breast cancer   . Gastritis     Allergies  Allergen Reactions  . Sulfa Antibiotics Shortness Of Breath and Rash  . Penicillins Rash     Review of Systems:  No  headache, visual changes, nausea, vomiting, diarrhea, constipation, dizziness, abdominal pain, skin rash, fevers, chills, night sweats, weight loss, swollen lymph nodes, body aches, joint swelling, chest pain, shortness of breath, mood changes. POSITIVE muscle aches  Objective  There were no vitals taken for this visit.   General: No apparent distress alert and oriented x3 mood and affect normal, dressed appropriately.  HEENT: Pupils equal, extraocular movements intact  Respiratory: Patient's speak in full sentences and does not appear short of breath  Cardiovascular: No lower extremity edema, non tender, no erythema  Neuro: Cranial nerves II through XII are intact, neurovascularly intact in all extremities with 2+ DTRs and 2+ pulses.  Gait normal with good balance and coordination.  MSK:  Non tender with full range of motion and good stability and symmetric strength and tone of shoulders, elbows, wrist, hip, knee and ankles bilaterally.  Back -low back exam shows the patient does have some tightness around joint.  Patient does have positive Corky Sox.  Patient has some pain over the piriformis again as well.  Patient does have a slipped rib syndrome noted tightness in the parascapular region as well.  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  Patient also having worsening of a piriformis syndrome.  Responded fairly  well to osteopathic manipulation.  Discussed meloxicam and a muscle relaxer at night.  We discussed the potential for gabapentin which patient is not taking regularly as well.  Given mild home exercises and follow-up again in 4 to 8 weeks  Patellofemoral syndrome of left knee Home exercises given, worsening symptoms will consider a Tru pull lite brace and  physical therapy    Nonallopathic problems  Decision today to treat with OMT was based on Physical Exam  After verbal consent patient was treated with HVLA, ME, FPR techniques in cervical, rib, thoracic, lumbar, and sacral  areas  Patient tolerated the procedure well with improvement in symptoms  Patient given exercises, stretches and lifestyle modifications  See medications in patient instructions if given  Patient will follow up in 4-8 weeks      The above documentation has been reviewed and is accurate and complete Lyndal Pulley, DO       Note: This dictation was prepared with Dragon dictation along with smaller phrase technology. Any transcriptional errors that result from this process are unintentional.

## 2020-01-06 NOTE — Assessment & Plan Note (Signed)
Home exercises given, worsening symptoms will consider a Tru pull lite brace and physical therapy

## 2020-01-06 NOTE — Patient Instructions (Signed)
Meloxicam daily for 10 days Zanaflex at night for 3 nights as needed Machine weights vs free weights See me in 5-6 weeks

## 2020-01-28 ENCOUNTER — Other Ambulatory Visit: Payer: Self-pay | Admitting: Family Medicine

## 2020-01-29 ENCOUNTER — Other Ambulatory Visit: Payer: Self-pay | Admitting: Family Medicine

## 2020-02-11 ENCOUNTER — Ambulatory Visit: Payer: No Typology Code available for payment source | Admitting: Family Medicine

## 2020-02-11 ENCOUNTER — Other Ambulatory Visit: Payer: Self-pay | Admitting: Family Medicine

## 2020-02-12 ENCOUNTER — Other Ambulatory Visit: Payer: Self-pay | Admitting: Family Medicine

## 2020-02-17 NOTE — Progress Notes (Deleted)
  [place] Phone: [phone] Subjective:    I'm seeing this patient by the request  of:  Patient, No Pcp Per  CC:   [id number]  Nicole Dixon is a 44 y.o. female coming in with complaint of back and neck pain. OMT 01/06/2020. Patient states   Medications patient has been prescribed: Pennsaid, Vit D, Meloxicam, Zanaflex, Gabapentin  Taking:         Reviewed prior external information including notes and imaging from previsou exam, outside providers and external EMR if available.   As well as notes that were available from care everywhere and other healthcare systems.  Past medical history, social, surgical and family history all reviewed in electronic medical record.  No pertanent information unless stated regarding to the chief complaint.   Past Medical History:  Diagnosis Date  . Family history of breast cancer   . Gastritis     Allergies  Allergen Reactions  . Sulfa Antibiotics Shortness Of Breath and Rash  . Penicillins Rash     Review of Systems:  No headache, visual changes, nausea, vomiting, diarrhea, constipation, dizziness, abdominal pain, skin rash, fevers, chills, night sweats, weight loss, swollen lymph nodes, body aches, joint swelling, chest pain, shortness of breath, mood changes. POSITIVE muscle aches  Objective  There were no vitals taken for this visit.   General: No apparent distress alert and oriented x3 mood and affect normal, dressed appropriately.  HEENT: Pupils equal, extraocular movements intact  Respiratory: Patient's speak in full sentences and does not appear short of breath  Cardiovascular: No lower extremity edema, non tender, no erythema  Neuro: Cranial nerves II through XII are intact, neurovascularly intact in all extremities with 2+ DTRs and 2+ pulses.  Gait normal with good balance and coordination.  MSK:  Non tender with full range of motion and good  stability and symmetric strength and tone of shoulders, elbows, wrist, hip, knee and ankles bilaterally.  Back - Normal skin, Spine with normal alignment and no deformity.  No tenderness to vertebral process palpation.  Paraspinous muscles are not tender and without spasm.   Range of motion is full at neck and lumbar sacral regions  Osteopathic findings  C2 flexed rotated and side bent right C6 flexed rotated and side bent left T3 extended rotated and side bent right inhaled rib T9 extended rotated and side bent left L2 flexed rotated and side bent right Sacrum right on right       Assessment and Plan:    Nonallopathic problems  Decision today to treat with OMT was based on Physical Exam  After verbal consent patient was treated with HVLA, ME, FPR techniques in cervical, rib, thoracic, lumbar, and sacral  areas  Patient tolerated the procedure well with improvement in symptoms  Patient given exercises, stretches and lifestyle modifications  See medications in patient instructions if given  Patient will follow up in 4-8 weeks      The above documentation has been reviewed and is accurate and complete Lyndal Pulley, DO       Note: This dictation was prepared with Dragon dictation along with smaller phrase technology. Any transcriptional errors that result from this process are unintentional.

## 2020-02-18 ENCOUNTER — Ambulatory Visit: Payer: No Typology Code available for payment source | Admitting: Family Medicine

## 2020-03-01 ENCOUNTER — Other Ambulatory Visit: Payer: Self-pay | Admitting: Family Medicine

## 2020-03-01 ENCOUNTER — Ambulatory Visit: Payer: No Typology Code available for payment source | Admitting: Family Medicine

## 2020-03-01 ENCOUNTER — Encounter: Payer: Self-pay | Admitting: Family Medicine

## 2020-03-01 ENCOUNTER — Other Ambulatory Visit: Payer: Self-pay

## 2020-03-01 VITALS — BP 110/72 | HR 69 | Ht 65.0 in | Wt 116.0 lb

## 2020-03-01 DIAGNOSIS — M999 Biomechanical lesion, unspecified: Secondary | ICD-10-CM

## 2020-03-01 DIAGNOSIS — M940 Chondrocostal junction syndrome [Tietze]: Secondary | ICD-10-CM | POA: Diagnosis not present

## 2020-03-01 MED ORDER — VITAMIN D (ERGOCALCIFEROL) 1.25 MG (50000 UNIT) PO CAPS: 50000.0000 [IU] | ORAL_CAPSULE | ORAL | 0 refills | Status: DC

## 2020-03-01 MED FILL — VIT D2 1.25 MG (50,000 UNIT: 1.25 MG | 84 days supply | Qty: 12 | Fill #0

## 2020-03-01 NOTE — Progress Notes (Signed)
[place] Phone: [phone] Subjective:   [name], am serving as a scribe for Dr. Hulan Saas. This visit occurred during the SARS-CoV-2 public health emergency.  Safety protocols were in place, including screening questions prior to the visit, additional usage of staff PPE, and extensive cleaning of exam room while observing appropriate contact time as indicated for disinfecting solutions.   I'm seeing this patient by the request  of:  Patient, No Pcp Per  CC: Neck and upper back pain follow-up  [id number]  ARPI DIEBOLD is a 44 y.o. female coming in with complaint of back and neck pain. OMT 01/06/2020. Patient states that her back is doing better. Patient took Meloxicam for 10 days and mm relaxer for 3 nights.    Medications patient has been prescribed: Gabapentin, Zanaflex, Vitamin D  Taking: Yes         Reviewed prior external information including notes and imaging from previsou exam, outside providers and external EMR if available.   As well as notes that were available from care everywhere and other healthcare systems.  Past medical history, social, surgical and family history all reviewed in electronic medical record.  No pertanent information unless stated regarding to the chief complaint.   Past Medical History:  Diagnosis Date  . Family history of breast cancer   . Gastritis     Allergies  Allergen Reactions  . Sulfa Antibiotics Shortness Of Breath and Rash  . Penicillins Rash     Review of Systems:  No headache, visual changes, nausea, vomiting, diarrhea, constipation, dizziness, abdominal pain, skin rash, fevers, chills, night sweats, weight loss, swollen lymph nodes, body aches, joint swelling, chest pain, shortness of breath, mood changes. POSITIVE muscle aches  Objective  There were no vitals taken for this visit.   General: No apparent distress alert and  oriented x3 mood and affect normal, dressed appropriately.  HEENT: Pupils equal, extraocular movements intact  Respiratory: Patient's speak in full sentences and does not appear short of breath  Cardiovascular: No lower extremity edema, non tender, no erythema  Neuro: Cranial nerves II through XII are intact, neurovascularly intact in all extremities with 2+ DTRs and 2+ pulses.  Gait normal with good balance and coordination.  MSK:  Non tender with full range of motion and good stability and symmetric strength and tone of shoulders, elbows, wrist, hip, knee and ankles bilaterally.  Back -upper back still feels tightness in the parascapular region right greater than left.  Not as severe as what it was last time.  Patient does have tenderness in the muscular area.  Osteopathic findings  C7 flexed rotated and side bent right  T3 extended rotated and side bent right inhaled rib T9 extended rotated and side bent left L2 flexed rotated and side bent right Sacrum right on right        Assessment and Plan:  Slipped rib syndrome Presents irritability.  Discussed icing regimen and home exercise, discussed posture and ergonomics, patient needs to continue to monitor directly mechanics.  Increase activity slowly.  Follow-up again in 6 to 8 weeks.   Nonallopathic problems  Decision today to treat with OMT was based on Physical Exam  After verbal consent patient was treated with HVLA, ME, FPR techniques in cervical, rib, thoracic, areas  Patient tolerated the procedure well with improvement in symptoms  Patient given exercises, stretches and lifestyle modifications  See medications in patient instructions if given  Patient will follow up in 4-8 weeks      The above documentation has been reviewed and is accurate and complete Lyndal Pulley, DO       Note: This dictation was prepared with Dragon dictation along with smaller phrase technology. Any transcriptional errors that result  from this process are unintentional.

## 2020-03-01 NOTE — Assessment & Plan Note (Signed)
Presents irritability.  Discussed icing regimen and home exercise, discussed posture and ergonomics, patient needs to continue to monitor directly mechanics.  Increase activity slowly.  Follow-up again in 6 to 8 weeks.

## 2020-03-01 NOTE — Patient Instructions (Signed)
Keep up exercises Yoga wheel for shoulders Vitamin D refilled  See me again in 5-6 weeks

## 2020-03-08 ENCOUNTER — Ambulatory Visit: Payer: No Typology Code available for payment source | Admitting: Family Medicine

## 2020-04-05 ENCOUNTER — Ambulatory Visit: Payer: No Typology Code available for payment source | Admitting: Family Medicine

## 2020-04-05 NOTE — Progress Notes (Deleted)
[place] [address] [place] Phone: [phone] Subjective:    I'm seeing this patient by the request  of:  Patient, No Pcp Per  CC:   [id number]  Nicole Dixon is a 44 y.o. female coming in with complaint of ***  Onset-  Location Duration-  Character- Aggravating factors- Reliving factors-  Therapies tried-  Severity-     Past Medical History:  Diagnosis Date  . Family history of breast cancer   . Gastritis    Past Surgical History:  Procedure Laterality Date  . KNEE SURGERY Left    Social History   Socioeconomic History  . Marital status: Married    Spouse name: Not on file  . Number of children: 4  . Years of education: Not on file  . Highest education level: Not on file  Occupational History  . Not on file  Tobacco Use  . Smoking status: Never Smoker  . Smokeless tobacco: Never Used  Substance and Sexual Activity  . Alcohol use: Yes    Alcohol/week: 0.0 standard drinks  . Drug use: Never  . Sexual activity: Not on file  Other Topics Concern  . Not on file  Social History Narrative  . Not on file   Social Determinants of Health   Financial Resource Strain:   . Difficulty of Paying Living Expenses: Not on file  Food Insecurity:   . Worried About Charity fundraiser in the Last Year: Not on file  . Ran Out of Food in the Last Year: Not on file  Transportation Needs:   . Lack of Transportation (Medical): Not on file  . Lack of Transportation (Non-Medical): Not on file  Physical Activity:   . Days of Exercise per Week: Not on file  . Minutes of Exercise per Session: Not on file  Stress:   . Feeling of Stress : Not on file  Social Connections:   . Frequency of Communication with Friends and Family: Not on file  . Frequency of Social Gatherings with Friends and Family: Not on file  . Attends Religious Services: Not on file  . Active Member of Clubs or Organizations: Not on file  .  Attends Archivist Meetings: Not on file  . Marital Status: Not on file   Allergies  Allergen Reactions  . Sulfa Antibiotics Shortness Of Breath and Rash  . Penicillins Rash   Family History  Problem Relation Age of Onset  . Breast cancer Sister 40       PALB2+  . Breast cancer Maternal Aunt 64  . Bladder Cancer Maternal Aunt 64  . Dementia Maternal Grandmother   . Breast cancer Paternal Grandmother        dx in her 36s  . Breast cancer Cousin        dx in her late 35s; BRCA-  . Cancer Cousin        leiomyosarcoma  . Breast cancer Other        MGMs sister  . Breast cancer Other        MGMs sister  . Cancer Other        PGFs twin brother with Cancer NOS    Current Outpatient Medications (Endocrine & Metabolic):  .  levonorgestrel (MIRENA) 20 MCG/24HR IUD, 1 each by Intrauterine route once.    Current Outpatient Medications (Analgesics):  .  meloxicam (MOBIC) 7.5 MG tablet, TAKE 1 TABLET BY MOUTH EVERY DAY .  oxyCODONE (ROXICODONE)  5 MG immediate release tablet, Take 1 tablet (5 mg total) by mouth every 6 (six) hours as needed for severe pain.   Current Outpatient Medications (Other):  .  cholecalciferol (VITAMIN D) 1000 units tablet, Take 2,000 Units by mouth daily. .  ciprofloxacin (CIPRO) 500 MG tablet, Take 1 tablet (500 mg total) by mouth 2 (two) times daily. One po bid x 7 days .  Diclofenac Sodium (PENNSAID) 2 % SOLN, Place 2 g onto the skin 2 (two) times daily. Marland Kitchen  gabapentin (NEURONTIN) 100 MG capsule, Take 1 capsule (100 mg total) by mouth at bedtime. .  metroNIDAZOLE (FLAGYL) 500 MG tablet, Take 1 tablet (500 mg total) by mouth 2 (two) times daily. .  Multiple Vitamin (MULTIVITAMIN WITH MINERALS) TABS tablet, Take 1 tablet by mouth daily. .  ondansetron (ZOFRAN ODT) 4 MG disintegrating tablet, 19m ODT q4 hours prn nausea/vomit .  pantoprazole (PROTONIX) 40 MG tablet, Take 40 mg by mouth 2 (two) times daily. .  Probiotic Product (PROBIOTIC PO), Take 1  tablet by mouth daily. .Marland Kitchen tiZANidine (ZANAFLEX) 4 MG tablet, TAKE 1 TABLET (4 MG TOTAL) BY MOUTH AT BEDTIME. .Marland Kitchen Vitamin D, Ergocalciferol, (DRISDOL) 1.25 MG (50000 UNIT) CAPS capsule, TAKE 1 CAPSULE (50,000 UNITS TOTAL) BY MOUTH EVERY 7 (SEVEN) DAYS. .Marland Kitchen Vitamin D, Ergocalciferol, (DRISDOL) 1.25 MG (50000 UNIT) CAPS capsule, Take 1 capsule (50,000 Units total) by mouth every 7 (seven) days.   Reviewed prior external information including notes and imaging from  primary care provider As well as notes that were available from care everywhere and other healthcare systems.  Past medical history, social, surgical and family history all reviewed in electronic medical record.  No pertanent information unless stated regarding to the chief complaint.   Review of Systems:  No headache, visual changes, nausea, vomiting, diarrhea, constipation, dizziness, abdominal pain, skin rash, fevers, chills, night sweats, weight loss, swollen lymph nodes, body aches, joint swelling, chest pain, shortness of breath, mood changes. POSITIVE muscle aches  Objective  There were no vitals taken for this visit.   General: No apparent distress alert and oriented x3 mood and affect normal, dressed appropriately.  HEENT: Pupils equal, extraocular movements intact  Respiratory: Patient's speak in full sentences and does not appear short of breath  Cardiovascular: No lower extremity edema, non tender, no erythema  Neuro: Cranial nerves II through XII are intact, neurovascularly intact in all extremities with 2+ DTRs and 2+ pulses.  Gait normal with good balance and coordination.  MSK:  Non tender with full range of motion and good stability and symmetric strength and tone of shoulders, elbows, wrist, hip, knee and ankles bilaterally.     Impression and Recommendations:     The above documentation has been reviewed and is accurate and complete Nicole Pulley DO

## 2020-06-17 NOTE — Progress Notes (Deleted)
  [place] Phone: [phone] Subjective:    I'm seeing this patient by the request  of:  Patient, No Pcp Per  CC:   [id number]  Nicole Dixon is a 45 y.o. female coming in with complaint of back and neck pain. OMT 03/01/2020. Patient states   Medications patient has been prescribed: Meloxicam, Vit D, Zanaflex  Taking:         Reviewed prior external information including notes and imaging from previsou exam, outside providers and external EMR if available.   As well as notes that were available from care everywhere and other healthcare systems.  Past medical history, social, surgical and family history all reviewed in electronic medical record.  No pertanent information unless stated regarding to the chief complaint.   Past Medical History:  Diagnosis Date  . Family history of breast cancer   . Gastritis     Allergies  Allergen Reactions  . Sulfa Antibiotics Shortness Of Breath and Rash  . Penicillins Rash     Review of Systems:  No headache, visual changes, nausea, vomiting, diarrhea, constipation, dizziness, abdominal pain, skin rash, fevers, chills, night sweats, weight loss, swollen lymph nodes, body aches, joint swelling, chest pain, shortness of breath, mood changes. POSITIVE muscle aches  Objective  There were no vitals taken for this visit.   General: No apparent distress alert and oriented x3 mood and affect normal, dressed appropriately.  HEENT: Pupils equal, extraocular movements intact  Respiratory: Patient's speak in full sentences and does not appear short of breath  Cardiovascular: No lower extremity edema, non tender, no erythema  Neuro: Cranial nerves II through XII are intact, neurovascularly intact in all extremities with 2+ DTRs and 2+ pulses.  Gait normal with good balance and coordination.  MSK:  Non tender with full range of motion and good stability and  symmetric strength and tone of shoulders, elbows, wrist, hip, knee and ankles bilaterally.  Back - Normal skin, Spine with normal alignment and no deformity.  No tenderness to vertebral process palpation.  Paraspinous muscles are not tender and without spasm.   Range of motion is full at neck and lumbar sacral regions  Osteopathic findings  C2 flexed rotated and side bent right C6 flexed rotated and side bent left T3 extended rotated and side bent right inhaled rib T9 extended rotated and side bent left L2 flexed rotated and side bent right Sacrum right on right       Assessment and Plan:    Nonallopathic problems  Decision today to treat with OMT was based on Physical Exam  After verbal consent patient was treated with HVLA, ME, FPR techniques in cervical, rib, thoracic, lumbar, and sacral  areas  Patient tolerated the procedure well with improvement in symptoms  Patient given exercises, stretches and lifestyle modifications  See medications in patient instructions if given  Patient will follow up in 4-8 weeks      The above documentation has been reviewed and is accurate and complete [name]       Note: This dictation was prepared with Dragon dictation along with smaller phrase technology. Any transcriptional errors that result from this process are unintentional.

## 2020-06-22 ENCOUNTER — Ambulatory Visit: Payer: No Typology Code available for payment source | Admitting: Family Medicine

## 2020-06-24 ENCOUNTER — Ambulatory Visit: Payer: No Typology Code available for payment source | Admitting: Family Medicine

## 2020-10-08 NOTE — Progress Notes (Signed)
[facility] [address] [place] Phone: [phone] Subjective:   I Nicole Dixon am serving as a Education administrator for Dr. Hulan Saas.  This visit occurred during the SARS-CoV-2 public health emergency.  Safety protocols were in place, including screening questions prior to the visit, additional usage of staff PPE, and extensive cleaning of exam room while observing appropriate contact time as indicated for disinfecting solutions.   I'm seeing this patient by the request  of:  Patient, No Pcp Per (Inactive)  CC: Right shoulder pain  [id number]  Nicole Dixon is a 45 y.o. female coming in with complaint of right shoulder pain and popping. Last seen in October 2021 for OMT. 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 States she felt a deep pull. Pain can radiate down her arm at times.   Onset- Chronic  Location - deep posterior pain Character- sharp Aggravating factors- certain ROM limited due to pain, sleeping on the right side, extension and reaching behind her  Reliving factors-  Therapies tried- injection, ice, ibuprofen at night, meloxicam  Severity- 4-5/10 at its worse      Past Medical History:  Diagnosis Date  . Family history of breast cancer   . Gastritis    Past Surgical History:  Procedure Laterality Date  . KNEE SURGERY Left    Social History   Socioeconomic History  . Marital status: Married    Spouse name: Not on file  . Number of children: 4  . Years of education: Not on file  . Highest education level: Not on file  Occupational History  . Not on file  Tobacco Use  . Smoking status: Never Smoker  . Smokeless tobacco: Never Used  Substance and Sexual Activity  . Alcohol use: Yes    Alcohol/week: 0.0 standard drinks  . Drug use: Never  . Sexual activity: Not on file  Other Topics Concern  . Not on file  Social History Narrative  . Not on file   Social Determinants of Health    Financial Resource Strain: Not on file  Food Insecurity: Not on file  Transportation Needs: Not on file  Physical Activity: Not on file  Stress: Not on file  Social Connections: Not on file   Allergies  Allergen Reactions  . Sulfa Antibiotics Shortness Of Breath and Rash  . Penicillins Rash   Family History  Problem Relation Age of Onset  . Breast cancer Sister 40       PALB2+  . Breast cancer Maternal Aunt 64  . Bladder Cancer Maternal Aunt 64  . Dementia Maternal Grandmother   . Breast cancer Paternal Grandmother        dx in her 69s  . Breast cancer Cousin        dx in her late 77s; BRCA-  . Cancer Cousin        leiomyosarcoma  . Breast cancer Other        MGMs sister  . Breast cancer Other        MGMs sister  . Cancer Other        PGFs twin brother with Cancer NOS    Current Outpatient Medications (Endocrine & Metabolic):  .  levonorgestrel (MIRENA) 20 MCG/24HR IUD, 1 each by Intrauterine route once.    Current Outpatient Medications (Analgesics):  .  meloxicam (MOBIC) 7.5 MG tablet, TAKE 1 TABLET BY MOUTH EVERY DAY .  oxyCODONE (ROXICODONE) 5 MG immediate release  tablet, Take 1 tablet (5 mg total) by mouth every 6 (six) hours as needed for severe pain.   Current Outpatient Medications (Other):  .  cholecalciferol (VITAMIN D) 1000 units tablet, Take 2,000 Units by mouth daily. .  ciprofloxacin (CIPRO) 500 MG tablet, Take 1 tablet (500 mg total) by mouth 2 (two) times daily. One po bid x 7 days .  Diclofenac Sodium (PENNSAID) 2 % SOLN, Place 2 g onto the skin 2 (two) times daily. .  fluconazole (DIFLUCAN) 150 MG tablet, TAKE 1 TABLET BY MOUTH EVERY 72 HOURS AS DIRECTED .  gabapentin (NEURONTIN) 100 MG capsule, Take 1 capsule (100 mg total) by mouth at bedtime. .  metroNIDAZOLE (FLAGYL) 500 MG tablet, Take 1 tablet (500 mg total) by mouth 2 (two) times daily. .  Multiple Vitamin (MULTIVITAMIN WITH MINERALS) TABS tablet, Take 1 tablet by mouth daily. .   ondansetron (ZOFRAN ODT) 4 MG disintegrating tablet, 38m ODT q4 hours prn nausea/vomit .  pantoprazole (PROTONIX) 40 MG tablet, Take 40 mg by mouth 2 (two) times daily. .  Probiotic Product (PROBIOTIC PO), Take 1 tablet by mouth daily. .Marland Kitchen tiZANidine (ZANAFLEX) 4 MG tablet, TAKE 1 TABLET (4 MG TOTAL) BY MOUTH AT BEDTIME. .Marland Kitchen Vitamin D, Ergocalciferol, (DRISDOL) 1.25 MG (50000 UNIT) CAPS capsule, TAKE 1 CAPSULE (50,000 UNITS TOTAL) BY MOUTH EVERY 7 (SEVEN) DAYS. .Marland Kitchen Vitamin D, Ergocalciferol, (DRISDOL) 1.25 MG (50000 UNIT) CAPS capsule, TAKE 1 CAPSULE BY MOUTH EVERY 7 DAYS   Reviewed prior external information including notes and imaging from  primary care provider As well as notes that were available from care everywhere and other healthcare systems.  Past medical history, social, surgical and family history all reviewed in electronic medical record.  No pertanent information unless stated regarding to the chief complaint.   Review of Systems:  No headache, visual changes, nausea, vomiting, diarrhea, constipation, dizziness, abdominal pain, skin rash, fevers, chills, night sweats, weight loss, swollen lymph nodes, body aches, joint swelling, chest pain, shortness of breath, mood changes. POSITIVE muscle aches  Objective  Blood pressure 102/80, pulse 93, height 5' 5"  (1.651 m), SpO2 97 %.   General: No apparent distress alert and oriented x3 mood and affect normal, dressed appropriately.  HEENT: Pupils equal, extraocular movements intact  Respiratory: Patient's speak in full sentences and does not appear short of breath  Cardiovascular: No lower extremity edema, non tender, no erythema  Gait normal with good balance and coordination.  MSK: Right shoulder exam shows the patient does have mild positive crossover.  Rotator cuff strength though does appear to be intact.  Mild positive O'Brien's test noted.  Mild pain of the glenohumeral and the acromioclavicular joint on the right side.  Neck exam  does have full range of motion noted.  Negative Spurling's.  5-5 strength of the upper extremities  Limited musculoskeletal ultrasound was performed and interpreted Nicole Dixon Limited ultrasound of patient's right shoulder shows the patient does have calcific changes noted of the acromioclavicular joint as well as does seem to have some loose bodies noted of the glenohumeral joint on the posterior aspect.  Rotator cuff appears to be intact.  Bicep is unremarkable. Impression: Nonspecific calcific changes of the acromioclavicular and glenohumeral joint.   Impression and Recommendations:     The above documentation has been reviewed and is accurate and complete Nicole Pulley DO

## 2020-10-12 ENCOUNTER — Ambulatory Visit: Payer: Self-pay

## 2020-10-12 ENCOUNTER — Other Ambulatory Visit: Payer: Self-pay

## 2020-10-12 ENCOUNTER — Ambulatory Visit: Payer: 59 | Admitting: Family Medicine

## 2020-10-12 ENCOUNTER — Encounter: Payer: Self-pay | Admitting: Family Medicine

## 2020-10-12 ENCOUNTER — Ambulatory Visit (INDEPENDENT_AMBULATORY_CARE_PROVIDER_SITE_OTHER): Payer: 59

## 2020-10-12 VITALS — BP 102/80 | HR 93 | Ht 65.0 in

## 2020-10-12 DIAGNOSIS — G8929 Other chronic pain: Secondary | ICD-10-CM

## 2020-10-12 DIAGNOSIS — M25511 Pain in right shoulder: Secondary | ICD-10-CM

## 2020-10-12 DIAGNOSIS — M542 Cervicalgia: Secondary | ICD-10-CM | POA: Diagnosis not present

## 2020-10-12 DIAGNOSIS — S4351XA Sprain of right acromioclavicular joint, initial encounter: Secondary | ICD-10-CM | POA: Diagnosis not present

## 2020-10-12 NOTE — Patient Instructions (Addendum)
Good to see you xrays today Exercises for the shoulder pennsaid for the shoulder Keep taking antiinflammatory at night Tennis ball for the back right pocket when sitting long periods Continue exercises See me again in 6 weeks

## 2020-10-12 NOTE — Assessment & Plan Note (Signed)
Patient has had a sprain of this previously.  Still has some calcific changes noted.  Patient also has calcific changes noted of the glenohumeral joint noted on the right side.  Discussed with patient's vitamin D.  Patient is going to see primary care physician and will be getting a vitamin D as well as uric acid tested.  X-rays are pending.  We discussed topical anti-inflammatories which are given.  Patient has muscle relaxer if needed.  Patient states that that she does feel very tired with the gabapentin.  Follow-up with me again in 6 to 8 weeks

## 2020-10-21 DIAGNOSIS — B029 Zoster without complications: Secondary | ICD-10-CM | POA: Diagnosis not present

## 2020-10-27 DIAGNOSIS — Z0000 Encounter for general adult medical examination without abnormal findings: Secondary | ICD-10-CM | POA: Diagnosis not present

## 2020-10-27 DIAGNOSIS — Z13220 Encounter for screening for lipoid disorders: Secondary | ICD-10-CM | POA: Diagnosis not present

## 2020-10-27 DIAGNOSIS — Z8619 Personal history of other infectious and parasitic diseases: Secondary | ICD-10-CM | POA: Diagnosis not present

## 2020-10-27 DIAGNOSIS — Z1211 Encounter for screening for malignant neoplasm of colon: Secondary | ICD-10-CM | POA: Diagnosis not present

## 2020-10-27 DIAGNOSIS — F418 Other specified anxiety disorders: Secondary | ICD-10-CM | POA: Diagnosis not present

## 2020-10-27 DIAGNOSIS — M7581 Other shoulder lesions, right shoulder: Secondary | ICD-10-CM | POA: Diagnosis not present

## 2020-10-29 ENCOUNTER — Other Ambulatory Visit (HOSPITAL_COMMUNITY): Payer: Self-pay

## 2020-10-29 MED ORDER — VALACYCLOVIR HCL 1 G PO TABS
1000.0000 mg | ORAL_TABLET | Freq: Three times a day (TID) | ORAL | 0 refills | Status: DC
  Filled 2020-10-29: qty 21, 7d supply, fill #0

## 2020-11-11 ENCOUNTER — Other Ambulatory Visit (HOSPITAL_COMMUNITY): Payer: Self-pay

## 2020-11-11 DIAGNOSIS — Z01419 Encounter for gynecological examination (general) (routine) without abnormal findings: Secondary | ICD-10-CM | POA: Diagnosis not present

## 2020-11-11 DIAGNOSIS — N760 Acute vaginitis: Secondary | ICD-10-CM | POA: Diagnosis not present

## 2020-11-11 DIAGNOSIS — B373 Candidiasis of vulva and vagina: Secondary | ICD-10-CM | POA: Diagnosis not present

## 2020-11-11 DIAGNOSIS — Z1231 Encounter for screening mammogram for malignant neoplasm of breast: Secondary | ICD-10-CM | POA: Diagnosis not present

## 2020-11-11 MED ORDER — FLUCONAZOLE 150 MG PO TABS
ORAL_TABLET | ORAL | 4 refills | Status: DC
  Filled 2020-11-11: qty 3, 6d supply, fill #0
  Filled 2020-12-03: qty 3, 6d supply, fill #1
  Filled 2020-12-24: qty 3, 6d supply, fill #2
  Filled 2021-01-19: qty 3, 6d supply, fill #3
  Filled 2021-01-28: qty 3, 6d supply, fill #4

## 2020-11-11 MED ORDER — ESCITALOPRAM OXALATE 10 MG PO TABS
ORAL_TABLET | ORAL | 4 refills | Status: DC
  Filled 2020-11-11: qty 90, 90d supply, fill #0

## 2020-11-23 ENCOUNTER — Ambulatory Visit: Payer: 59 | Admitting: Family Medicine

## 2020-11-23 NOTE — Progress Notes (Deleted)
[place] Phone: [phone] Subjective:    I'm seeing this patient by the request  of:  Patient, No Pcp Per (Inactive)  CC:   [id number]  10/12/2020 Patient has had a sprain of this previously.  Still has some calcific changes noted.  Patient also has calcific changes noted of the glenohumeral joint noted on the right side.  Discussed with patient's vitamin D.  Patient is going to see primary care physician and will be getting a vitamin D as well as uric acid tested.  X-rays are pending.  We discussed topical anti-inflammatories which are given.  Patient has muscle relaxer if needed.  Patient states that that she does feel very tired with the gabapentin.  Follow-up with me again in 6 to 8 weeks  Update 11/24/2020 Nicole Dixon is a 45 y.o. female coming in with complaint of R shoulder pain. Patient states  Onset-  Location Duration-  Character- Aggravating factors- Reliving factors-  Therapies tried-  Severity-     Past Medical History:  Diagnosis Date   Family history of breast cancer    Gastritis    Past Surgical History:  Procedure Laterality Date   KNEE SURGERY Left    Social History   Socioeconomic History   Marital status: Married    Spouse name: Not on file   Number of children: 4   Years of education: Not on file   Highest education level: Not on file  Occupational History   Not on file  Tobacco Use   Smoking status: Never   Smokeless tobacco: Never  Substance and Sexual Activity   Alcohol use: Yes    Alcohol/week: 0.0 standard drinks   Drug use: Never   Sexual activity: Not on file  Other Topics Concern   Not on file  Social History Narrative   Not on file   Social Determinants of Health   Financial Resource Strain: Not on file  Food Insecurity: Not on file  Transportation Needs: Not on file  Physical Activity: Not on file  Stress: Not on file  Social  Connections: Not on file   Allergies  Allergen Reactions   Sulfa Antibiotics Shortness Of Breath and Rash   Penicillins Rash   Family History  Problem Relation Age of Onset   Breast cancer Sister 52       PALB2+   Breast cancer Maternal Aunt 64   Bladder Cancer Maternal Aunt 64   Dementia Maternal Grandmother    Breast cancer Paternal Grandmother        dx in her 61s   Breast cancer Cousin        dx in her late 21s; BRCA-   Cancer Cousin        leiomyosarcoma   Breast cancer Other        MGMs sister   Breast cancer Other        MGMs sister   Cancer Other        PGFs twin brother with Cancer NOS    Current Outpatient Medications (Endocrine & Metabolic):    levonorgestrel (MIRENA) 20 MCG/24HR IUD, 1 each by Intrauterine route once.    Current Outpatient Medications (Analgesics):    meloxicam (MOBIC) 7.5 MG tablet, TAKE 1 TABLET BY MOUTH EVERY DAY   oxyCODONE (ROXICODONE) 5 MG immediate release tablet, Take 1 tablet (5 mg total) by mouth every 6 (six) hours as needed for severe pain.   Current Outpatient  Medications (Other):    cholecalciferol (VITAMIN D) 1000 units tablet, Take 2,000 Units by mouth daily.   ciprofloxacin (CIPRO) 500 MG tablet, Take 1 tablet (500 mg total) by mouth 2 (two) times daily. One po bid x 7 days   Diclofenac Sodium (PENNSAID) 2 % SOLN, Place 2 g onto the skin 2 (two) times daily.   escitalopram (LEXAPRO) 10 MG tablet, Take 1/2 tablet by mouth daily for 7 days then take 1 tablet daily   fluconazole (DIFLUCAN) 150 MG tablet, Take 1 tablet by mouth every other day for 3 doses   gabapentin (NEURONTIN) 100 MG capsule, Take 1 capsule (100 mg total) by mouth at bedtime.   metroNIDAZOLE (FLAGYL) 500 MG tablet, Take 1 tablet (500 mg total) by mouth 2 (two) times daily.   Multiple Vitamin (MULTIVITAMIN WITH MINERALS) TABS tablet, Take 1 tablet by mouth daily.   ondansetron (ZOFRAN ODT) 4 MG disintegrating tablet, 51m ODT q4 hours prn nausea/vomit    pantoprazole (PROTONIX) 40 MG tablet, Take 40 mg by mouth 2 (two) times daily.   Probiotic Product (PROBIOTIC PO), Take 1 tablet by mouth daily.   tiZANidine (ZANAFLEX) 4 MG tablet, TAKE 1 TABLET (4 MG TOTAL) BY MOUTH AT BEDTIME.   valACYclovir (VALTREX) 1000 MG tablet, Take 1 tablet (1,000 mg total) by mouth every 8 (eight) hours.   Vitamin D, Ergocalciferol, (DRISDOL) 1.25 MG (50000 UNIT) CAPS capsule, TAKE 1 CAPSULE (50,000 UNITS TOTAL) BY MOUTH EVERY 7 (SEVEN) DAYS.   Vitamin D, Ergocalciferol, (DRISDOL) 1.25 MG (50000 UNIT) CAPS capsule, TAKE 1 CAPSULE BY MOUTH EVERY 7 DAYS   Reviewed prior external information including notes and imaging from  primary care provider As well as notes that were available from care everywhere and other healthcare systems.  Past medical history, social, surgical and family history all reviewed in electronic medical record.  No pertanent information unless stated regarding to the chief complaint.   Review of Systems:  No headache, visual changes, nausea, vomiting, diarrhea, constipation, dizziness, abdominal pain, skin rash, fevers, chills, night sweats, weight loss, swollen lymph nodes, body aches, joint swelling, chest pain, shortness of breath, mood changes. POSITIVE muscle aches  Objective  There were no vitals taken for this visit.   General: No apparent distress alert and oriented x3 mood and affect normal, dressed appropriately.  HEENT: Pupils equal, extraocular movements intact  Respiratory: Patient's speak in full sentences and does not appear short of breath  Cardiovascular: No lower extremity edema, non tender, no erythema  Gait normal with good balance and coordination.  MSK:  Non tender with full range of motion and good stability and symmetric strength and tone of shoulders, elbows, wrist, hip, knee and ankles bilaterally.     Impression and Recommendations:     The above documentation has been reviewed and is accurate and complete  VJacqualin Combes

## 2020-12-03 ENCOUNTER — Other Ambulatory Visit (HOSPITAL_COMMUNITY): Payer: Self-pay

## 2020-12-13 ENCOUNTER — Other Ambulatory Visit (HOSPITAL_COMMUNITY): Payer: Self-pay

## 2020-12-13 MED ORDER — ACYCLOVIR 400 MG PO TABS
ORAL_TABLET | ORAL | 0 refills | Status: DC
  Filled 2020-12-13: qty 20, 10d supply, fill #0

## 2020-12-24 ENCOUNTER — Other Ambulatory Visit (HOSPITAL_COMMUNITY): Payer: Self-pay

## 2021-01-19 ENCOUNTER — Other Ambulatory Visit (HOSPITAL_COMMUNITY): Payer: Self-pay

## 2021-01-20 ENCOUNTER — Other Ambulatory Visit (HOSPITAL_COMMUNITY): Payer: Self-pay

## 2021-01-20 MED ORDER — FLUCONAZOLE 150 MG PO TABS
ORAL_TABLET | ORAL | 4 refills | Status: DC
  Filled 2021-01-20: qty 12, 84d supply, fill #0
  Filled 2021-04-06: qty 12, 84d supply, fill #1
  Filled 2021-06-28: qty 12, 84d supply, fill #2
  Filled 2021-09-23: qty 12, 84d supply, fill #3
  Filled 2021-12-06: qty 12, 84d supply, fill #4

## 2021-01-24 ENCOUNTER — Other Ambulatory Visit (HOSPITAL_COMMUNITY): Payer: Self-pay

## 2021-01-25 ENCOUNTER — Other Ambulatory Visit (HOSPITAL_COMMUNITY): Payer: Self-pay

## 2021-01-27 ENCOUNTER — Other Ambulatory Visit (HOSPITAL_COMMUNITY): Payer: Self-pay

## 2021-01-27 DIAGNOSIS — B009 Herpesviral infection, unspecified: Secondary | ICD-10-CM | POA: Diagnosis not present

## 2021-01-27 MED ORDER — VALACYCLOVIR HCL 1 G PO TABS
ORAL_TABLET | ORAL | 1 refills | Status: DC
  Filled 2021-01-27: qty 20, 5d supply, fill #0
  Filled 2021-08-12: qty 20, 5d supply, fill #1

## 2021-01-28 ENCOUNTER — Other Ambulatory Visit (HOSPITAL_COMMUNITY): Payer: Self-pay

## 2021-04-06 ENCOUNTER — Other Ambulatory Visit (HOSPITAL_COMMUNITY): Payer: Self-pay

## 2021-04-12 ENCOUNTER — Other Ambulatory Visit: Payer: Self-pay | Admitting: Obstetrics and Gynecology

## 2021-04-12 DIAGNOSIS — Z1231 Encounter for screening mammogram for malignant neoplasm of breast: Secondary | ICD-10-CM

## 2021-04-13 ENCOUNTER — Other Ambulatory Visit: Payer: Self-pay | Admitting: Obstetrics and Gynecology

## 2021-04-13 DIAGNOSIS — N644 Mastodynia: Secondary | ICD-10-CM

## 2021-05-24 DIAGNOSIS — H524 Presbyopia: Secondary | ICD-10-CM | POA: Diagnosis not present

## 2021-05-24 DIAGNOSIS — H52203 Unspecified astigmatism, bilateral: Secondary | ICD-10-CM | POA: Diagnosis not present

## 2021-05-24 DIAGNOSIS — H5213 Myopia, bilateral: Secondary | ICD-10-CM | POA: Diagnosis not present

## 2021-05-31 ENCOUNTER — Ambulatory Visit
Admission: RE | Admit: 2021-05-31 | Discharge: 2021-05-31 | Disposition: A | Discharge status: Home / Self Care | Payer: 59 | Source: Ambulatory Visit | Attending: Obstetrics and Gynecology | Admitting: Obstetrics and Gynecology

## 2021-05-31 DIAGNOSIS — N644 Mastodynia: Secondary | ICD-10-CM

## 2021-05-31 DIAGNOSIS — R922 Inconclusive mammogram: Secondary | ICD-10-CM | POA: Diagnosis not present

## 2021-06-29 ENCOUNTER — Other Ambulatory Visit (HOSPITAL_COMMUNITY): Payer: Self-pay

## 2021-08-12 ENCOUNTER — Other Ambulatory Visit (HOSPITAL_COMMUNITY): Payer: Self-pay

## 2021-09-24 ENCOUNTER — Other Ambulatory Visit (HOSPITAL_COMMUNITY): Payer: Self-pay

## 2021-11-30 DIAGNOSIS — I872 Venous insufficiency (chronic) (peripheral): Secondary | ICD-10-CM | POA: Diagnosis not present

## 2021-12-06 ENCOUNTER — Other Ambulatory Visit (HOSPITAL_COMMUNITY): Payer: Self-pay

## 2021-12-13 ENCOUNTER — Other Ambulatory Visit (HOSPITAL_COMMUNITY): Payer: Self-pay

## 2021-12-19 ENCOUNTER — Ambulatory Visit: Payer: 59 | Admitting: Family Medicine

## 2022-03-07 ENCOUNTER — Other Ambulatory Visit (HOSPITAL_COMMUNITY): Payer: Self-pay

## 2022-03-07 DIAGNOSIS — Z01419 Encounter for gynecological examination (general) (routine) without abnormal findings: Secondary | ICD-10-CM | POA: Diagnosis not present

## 2022-03-07 DIAGNOSIS — B078 Other viral warts: Secondary | ICD-10-CM | POA: Diagnosis not present

## 2022-03-07 DIAGNOSIS — D225 Melanocytic nevi of trunk: Secondary | ICD-10-CM | POA: Diagnosis not present

## 2022-03-07 DIAGNOSIS — Z5181 Encounter for therapeutic drug level monitoring: Secondary | ICD-10-CM | POA: Diagnosis not present

## 2022-03-07 DIAGNOSIS — L814 Other melanin hyperpigmentation: Secondary | ICD-10-CM | POA: Diagnosis not present

## 2022-03-07 MED ORDER — FLUCONAZOLE 150 MG PO TABS
150.0000 mg | ORAL_TABLET | ORAL | 4 refills | Status: DC
  Filled 2022-03-07: qty 12, 84d supply, fill #0
  Filled 2022-05-26: qty 12, 84d supply, fill #1
  Filled 2022-08-19: qty 12, 84d supply, fill #2
  Filled 2022-11-09: qty 12, 84d supply, fill #3
  Filled 2023-01-28: qty 12, 84d supply, fill #4

## 2022-03-08 ENCOUNTER — Other Ambulatory Visit (HOSPITAL_COMMUNITY): Payer: Self-pay

## 2022-03-30 DIAGNOSIS — R42 Dizziness and giddiness: Secondary | ICD-10-CM | POA: Diagnosis not present

## 2022-03-30 DIAGNOSIS — R002 Palpitations: Secondary | ICD-10-CM | POA: Diagnosis not present

## 2022-05-27 ENCOUNTER — Other Ambulatory Visit (HOSPITAL_COMMUNITY): Payer: Self-pay

## 2022-06-21 DIAGNOSIS — Z1231 Encounter for screening mammogram for malignant neoplasm of breast: Secondary | ICD-10-CM | POA: Diagnosis not present

## 2022-07-14 DIAGNOSIS — Z0000 Encounter for general adult medical examination without abnormal findings: Secondary | ICD-10-CM | POA: Diagnosis not present

## 2022-07-14 DIAGNOSIS — Z1211 Encounter for screening for malignant neoplasm of colon: Secondary | ICD-10-CM | POA: Diagnosis not present

## 2022-07-14 DIAGNOSIS — Z803 Family history of malignant neoplasm of breast: Secondary | ICD-10-CM | POA: Diagnosis not present

## 2022-07-14 DIAGNOSIS — B3731 Acute candidiasis of vulva and vagina: Secondary | ICD-10-CM | POA: Diagnosis not present

## 2022-07-14 DIAGNOSIS — K2950 Unspecified chronic gastritis without bleeding: Secondary | ICD-10-CM | POA: Diagnosis not present

## 2022-07-14 DIAGNOSIS — E559 Vitamin D deficiency, unspecified: Secondary | ICD-10-CM | POA: Diagnosis not present

## 2022-08-01 DIAGNOSIS — H5213 Myopia, bilateral: Secondary | ICD-10-CM | POA: Diagnosis not present

## 2022-08-01 DIAGNOSIS — H52203 Unspecified astigmatism, bilateral: Secondary | ICD-10-CM | POA: Diagnosis not present

## 2022-08-01 DIAGNOSIS — H524 Presbyopia: Secondary | ICD-10-CM | POA: Diagnosis not present

## 2022-08-16 ENCOUNTER — Other Ambulatory Visit: Payer: Self-pay

## 2022-08-16 ENCOUNTER — Other Ambulatory Visit (HOSPITAL_COMMUNITY): Payer: Self-pay

## 2022-08-16 MED ORDER — VALACYCLOVIR HCL 1 G PO TABS
ORAL_TABLET | ORAL | 1 refills | Status: DC
  Filled 2022-08-16: qty 20, 5d supply, fill #0
  Filled 2022-11-09: qty 20, 5d supply, fill #1

## 2022-08-21 ENCOUNTER — Other Ambulatory Visit: Payer: Self-pay

## 2022-10-19 DIAGNOSIS — K5730 Diverticulosis of large intestine without perforation or abscess without bleeding: Secondary | ICD-10-CM | POA: Diagnosis not present

## 2022-10-19 DIAGNOSIS — Z1211 Encounter for screening for malignant neoplasm of colon: Secondary | ICD-10-CM | POA: Diagnosis not present

## 2022-10-19 DIAGNOSIS — K648 Other hemorrhoids: Secondary | ICD-10-CM | POA: Diagnosis not present

## 2022-11-09 ENCOUNTER — Other Ambulatory Visit (HOSPITAL_COMMUNITY): Payer: Self-pay

## 2022-11-22 ENCOUNTER — Ambulatory Visit: Payer: Commercial Managed Care - PPO | Admitting: Sports Medicine

## 2023-01-28 ENCOUNTER — Other Ambulatory Visit (HOSPITAL_COMMUNITY): Payer: Self-pay

## 2023-01-29 ENCOUNTER — Other Ambulatory Visit (HOSPITAL_COMMUNITY): Payer: Self-pay

## 2023-03-15 DIAGNOSIS — H903 Sensorineural hearing loss, bilateral: Secondary | ICD-10-CM | POA: Diagnosis not present

## 2023-03-15 DIAGNOSIS — H9312 Tinnitus, left ear: Secondary | ICD-10-CM | POA: Diagnosis not present

## 2023-03-21 DIAGNOSIS — L814 Other melanin hyperpigmentation: Secondary | ICD-10-CM | POA: Diagnosis not present

## 2023-03-21 DIAGNOSIS — D225 Melanocytic nevi of trunk: Secondary | ICD-10-CM | POA: Diagnosis not present

## 2023-03-21 DIAGNOSIS — L818 Other specified disorders of pigmentation: Secondary | ICD-10-CM | POA: Diagnosis not present

## 2023-03-21 DIAGNOSIS — L578 Other skin changes due to chronic exposure to nonionizing radiation: Secondary | ICD-10-CM | POA: Diagnosis not present

## 2023-04-17 ENCOUNTER — Other Ambulatory Visit: Payer: Self-pay

## 2023-04-17 ENCOUNTER — Other Ambulatory Visit (HOSPITAL_COMMUNITY): Payer: Self-pay

## 2023-04-17 DIAGNOSIS — Z01419 Encounter for gynecological examination (general) (routine) without abnormal findings: Secondary | ICD-10-CM | POA: Diagnosis not present

## 2023-04-17 DIAGNOSIS — Z124 Encounter for screening for malignant neoplasm of cervix: Secondary | ICD-10-CM | POA: Diagnosis not present

## 2023-04-17 MED ORDER — FLUCONAZOLE 150 MG PO TABS
150.0000 mg | ORAL_TABLET | ORAL | 4 refills | Status: DC
  Filled 2023-04-17: qty 12, 84d supply, fill #0
  Filled 2023-07-13: qty 12, 84d supply, fill #1
  Filled 2023-10-03: qty 12, 84d supply, fill #2
  Filled 2024-01-16: qty 12, 84d supply, fill #3

## 2023-05-04 IMAGING — MG MM DIGITAL DIAGNOSTIC UNILAT*L* W/ TOMO W/ CAD
4 series · 4 of 12 positions shown · non-contrast
Comparison: Previous exam(s).

CLINICAL DATA: 45-year-old female with diffuse left breast pain
which has decreased and become more intermittent recently.

EXAM:
DIGITAL DIAGNOSTIC UNILATERAL LEFT MAMMOGRAM WITH TOMOSYNTHESIS AND
CAD
TECHNIQUE: Left digital diagnostic mammography and breast tomosynthesis was
performed. The images were evaluated with computer-aided detection.

[L CC synth-2D]
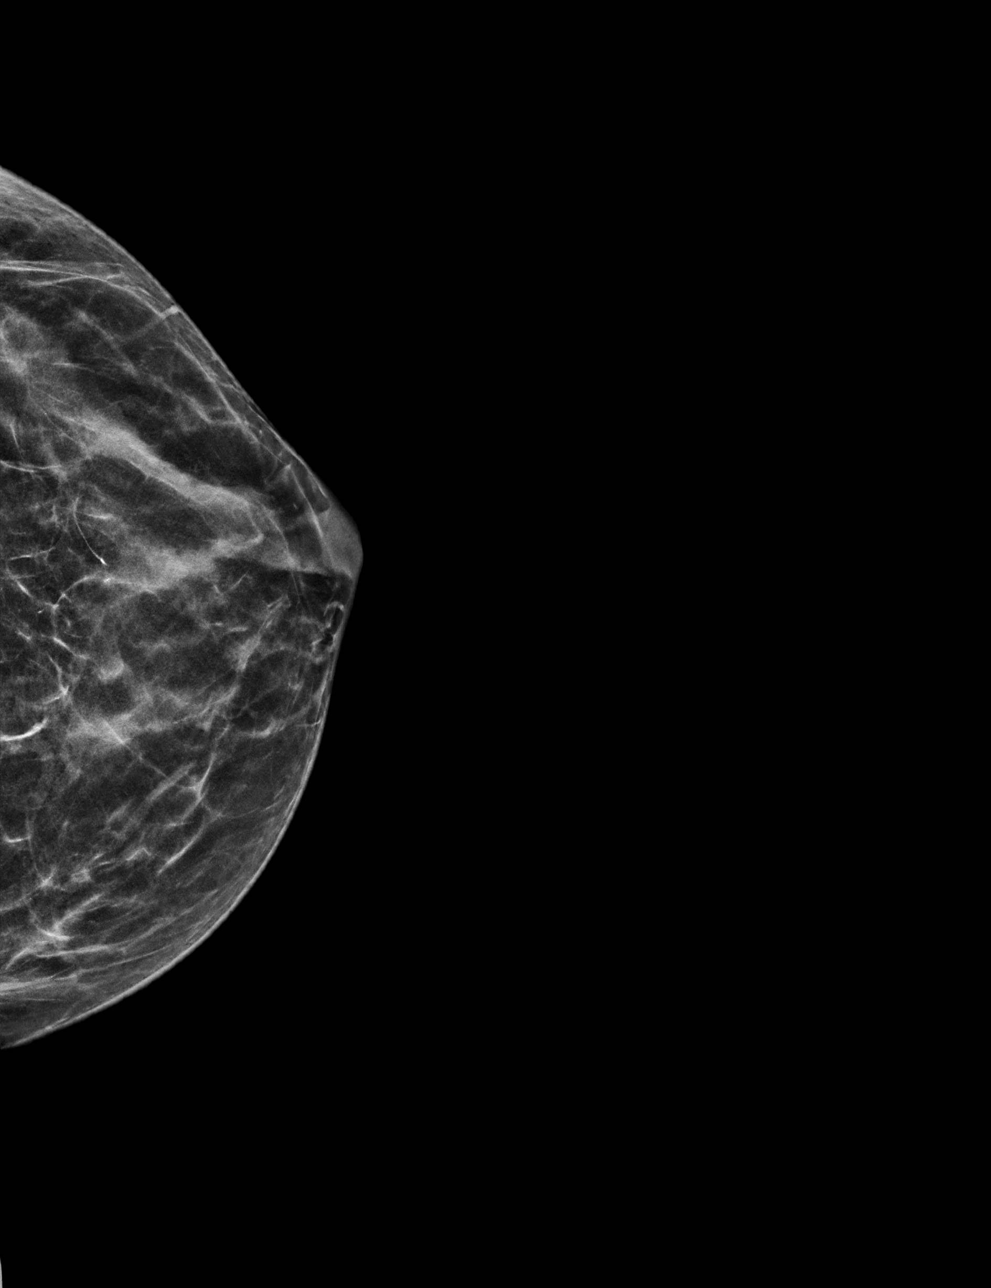

[L MLO synth-2D]
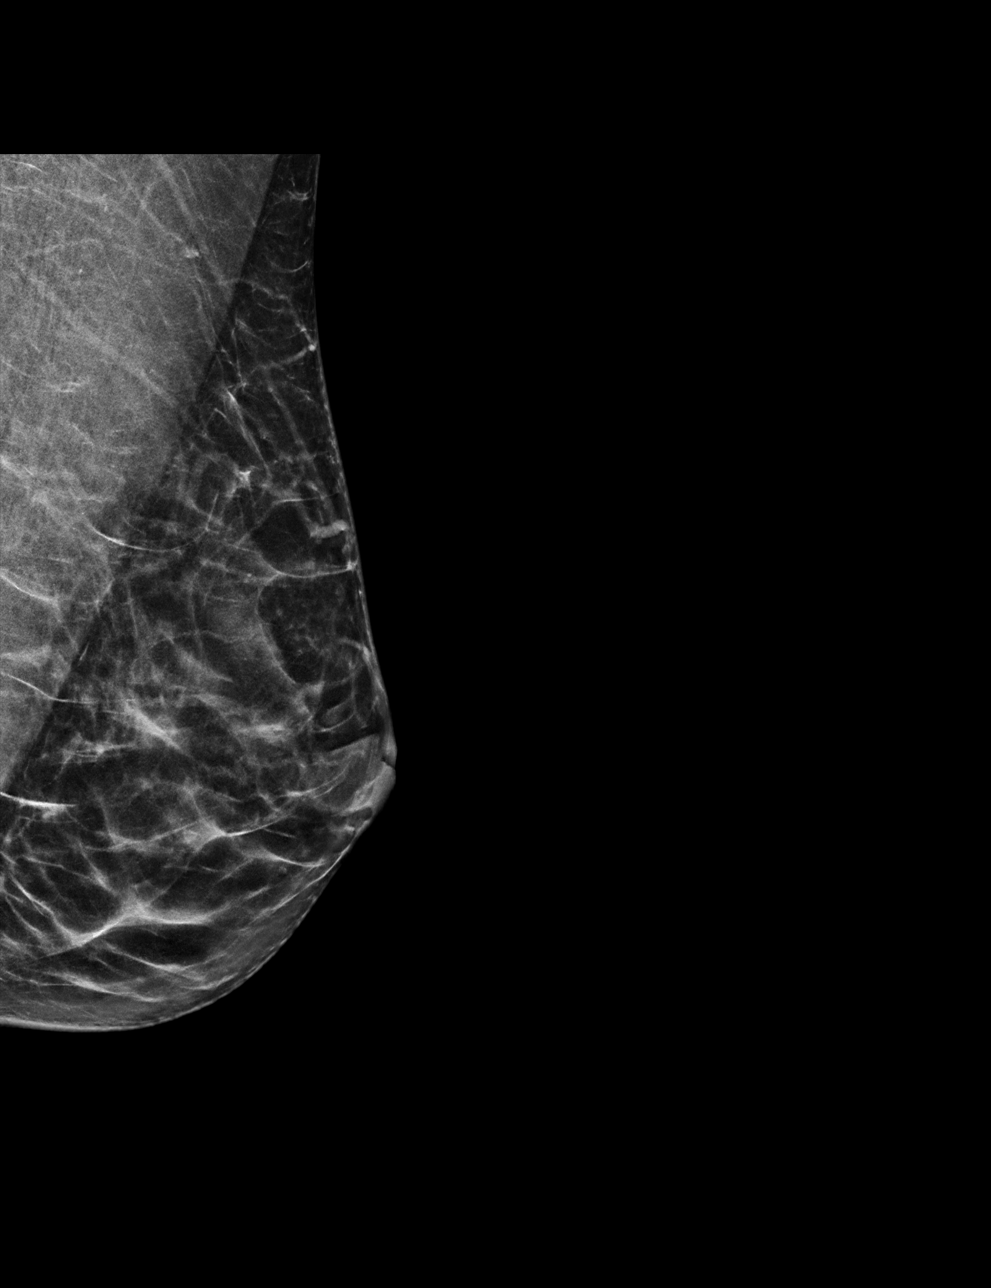

[L MLO tomo · tomo slice 26/51.0]
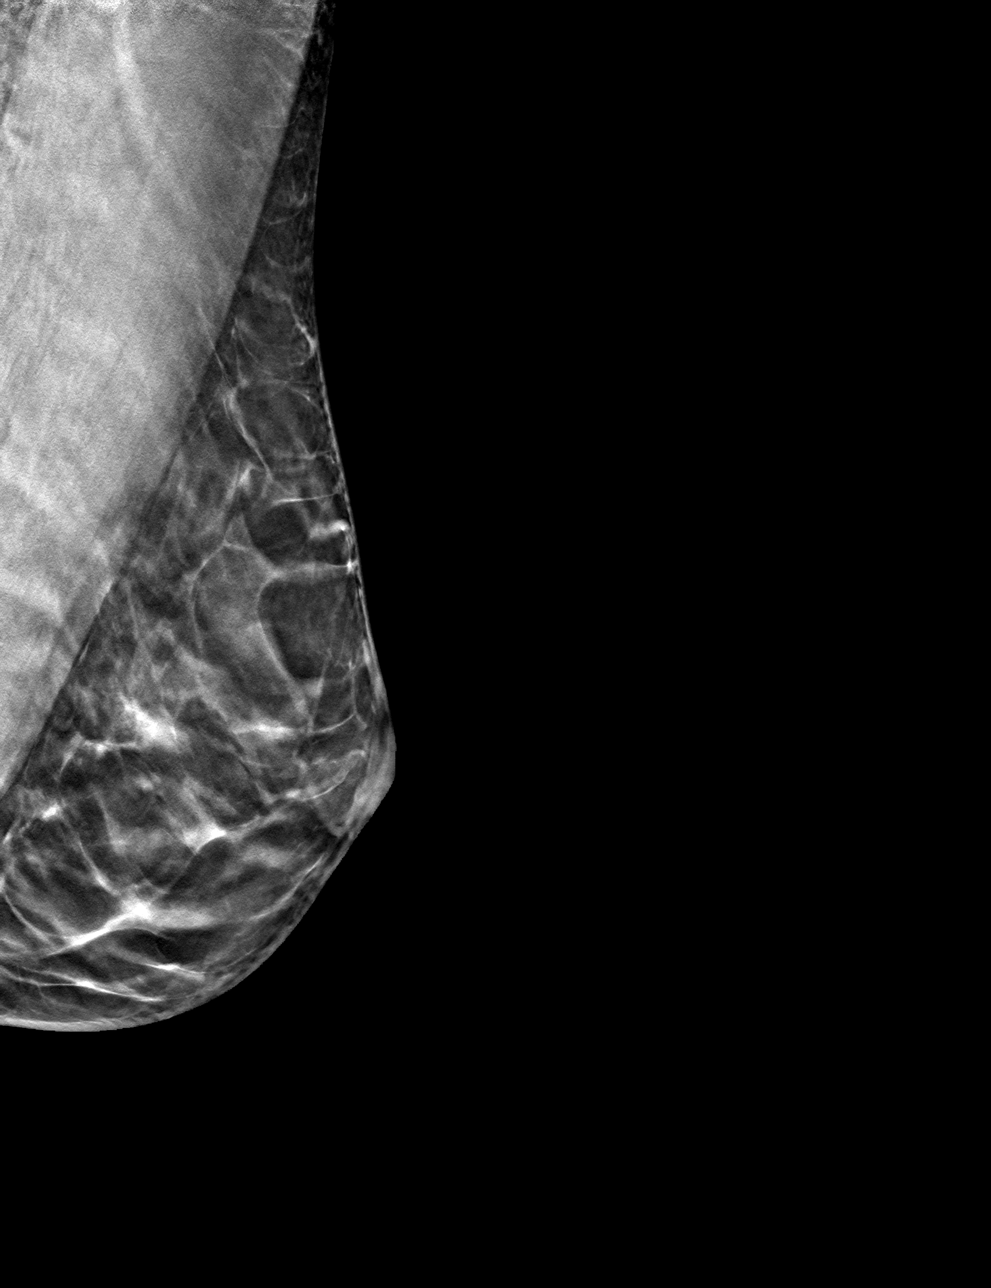

[L CC tomo · tomo slice 23/44.0]
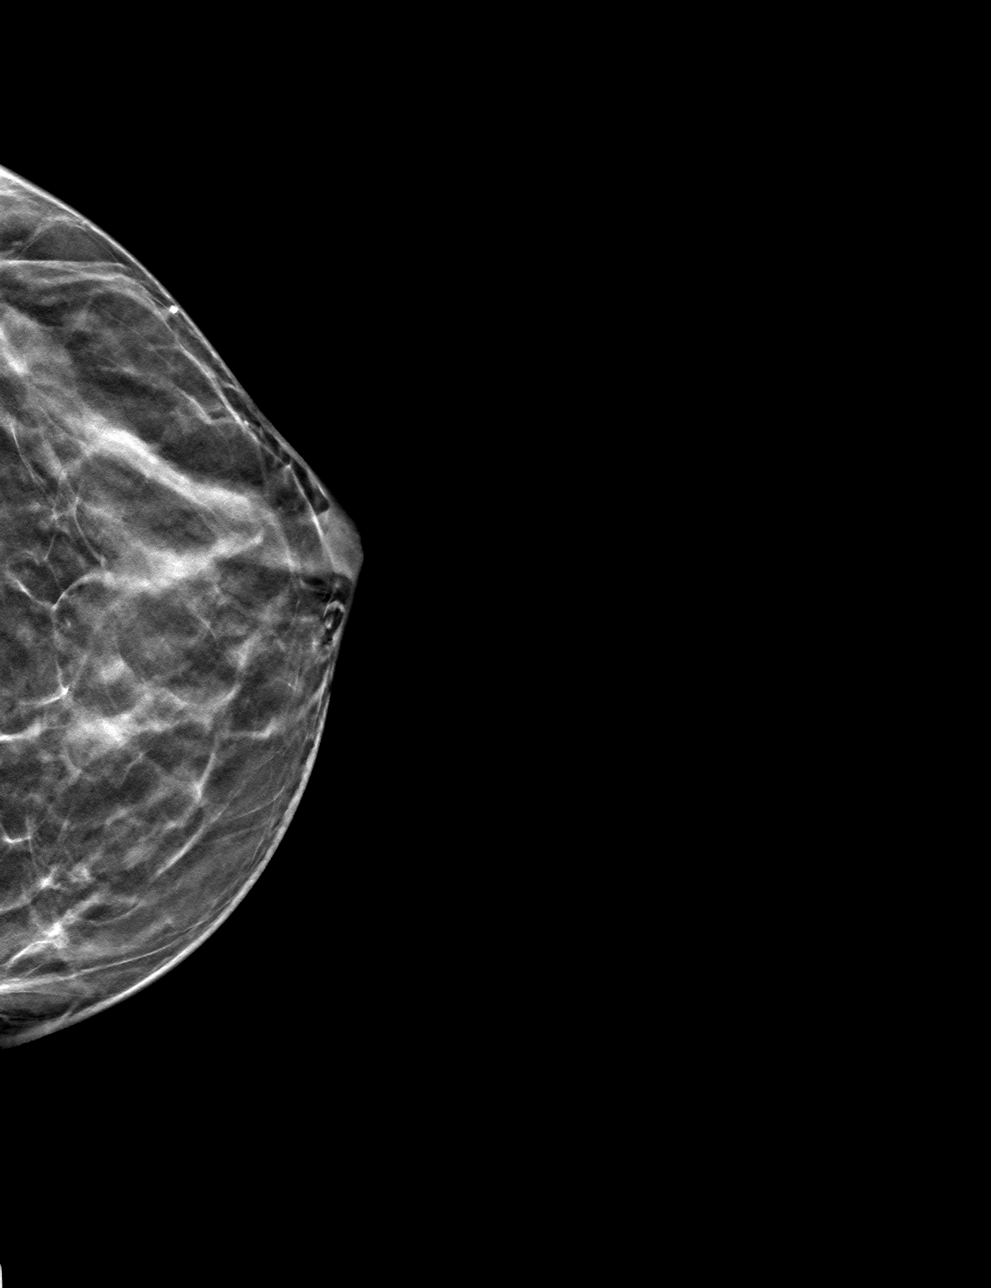

[4 of 12 positions shown; findings below may reference images not displayed]

ACR Breast Density Category c: The breast tissue is heterogeneously
dense, which may obscure small masses.
FINDINGS: No focal or suspicious mammographic findings are identified in the
left breast. The parenchymal pattern is stable.
IMPRESSION: No mammographic evidence of malignancy.

RECOMMENDATION:
1. Clinical follow-up recommended for the painful area of concern in
the left breast. Any further workup should be based on clinical
grounds.
2. Routine annual screening, due in October 2021.

I have discussed the findings and recommendations with the patient.
If applicable, a reminder letter will be sent to the patient
regarding the next appointment.

BI-RADS CATEGORY  1: Negative.

## 2023-05-08 ENCOUNTER — Other Ambulatory Visit (HOSPITAL_COMMUNITY): Payer: Self-pay

## 2023-05-08 MED ORDER — OSELTAMIVIR PHOSPHATE 75 MG PO CAPS
75.0000 mg | ORAL_CAPSULE | Freq: Every day | ORAL | 0 refills | Status: DC
  Filled 2023-05-08: qty 7, 7d supply, fill #0

## 2023-05-22 DIAGNOSIS — J019 Acute sinusitis, unspecified: Secondary | ICD-10-CM | POA: Diagnosis not present

## 2023-06-26 DIAGNOSIS — Z1231 Encounter for screening mammogram for malignant neoplasm of breast: Secondary | ICD-10-CM | POA: Diagnosis not present

## 2023-07-02 ENCOUNTER — Other Ambulatory Visit: Payer: Self-pay | Admitting: Obstetrics and Gynecology

## 2023-07-02 DIAGNOSIS — R928 Other abnormal and inconclusive findings on diagnostic imaging of breast: Secondary | ICD-10-CM

## 2023-07-10 ENCOUNTER — Other Ambulatory Visit: Payer: Self-pay | Admitting: Obstetrics and Gynecology

## 2023-07-10 ENCOUNTER — Ambulatory Visit
Admission: RE | Admit: 2023-07-10 | Discharge: 2023-07-10 | Disposition: A | Discharge status: Home / Self Care | Payer: Commercial Managed Care - PPO | Source: Ambulatory Visit | Attending: Obstetrics and Gynecology | Admitting: Obstetrics and Gynecology

## 2023-07-10 DIAGNOSIS — N6325 Unspecified lump in the left breast, overlapping quadrants: Secondary | ICD-10-CM | POA: Diagnosis not present

## 2023-07-10 DIAGNOSIS — R928 Other abnormal and inconclusive findings on diagnostic imaging of breast: Secondary | ICD-10-CM

## 2023-07-10 DIAGNOSIS — N6320 Unspecified lump in the left breast, unspecified quadrant: Secondary | ICD-10-CM

## 2023-07-13 ENCOUNTER — Other Ambulatory Visit (HOSPITAL_COMMUNITY): Payer: Self-pay

## 2023-07-17 ENCOUNTER — Ambulatory Visit
Admission: RE | Admit: 2023-07-17 | Discharge: 2023-07-17 | Disposition: A | Discharge status: Home / Self Care | Payer: Commercial Managed Care - PPO | Source: Ambulatory Visit | Attending: Obstetrics and Gynecology | Admitting: Obstetrics and Gynecology

## 2023-07-17 DIAGNOSIS — N6320 Unspecified lump in the left breast, unspecified quadrant: Secondary | ICD-10-CM

## 2023-07-17 DIAGNOSIS — N6012 Diffuse cystic mastopathy of left breast: Secondary | ICD-10-CM | POA: Diagnosis not present

## 2023-07-17 DIAGNOSIS — N6321 Unspecified lump in the left breast, upper outer quadrant: Secondary | ICD-10-CM | POA: Diagnosis not present

## 2023-07-17 HISTORY — PX: BREAST BIOPSY: SHX20

## 2023-07-18 LAB — SURGICAL PATHOLOGY

## 2023-08-03 ENCOUNTER — Other Ambulatory Visit (HOSPITAL_COMMUNITY): Payer: Self-pay

## 2023-08-03 DIAGNOSIS — H5213 Myopia, bilateral: Secondary | ICD-10-CM | POA: Diagnosis not present

## 2023-08-03 DIAGNOSIS — H52203 Unspecified astigmatism, bilateral: Secondary | ICD-10-CM | POA: Diagnosis not present

## 2023-08-03 DIAGNOSIS — H524 Presbyopia: Secondary | ICD-10-CM | POA: Diagnosis not present

## 2023-08-04 ENCOUNTER — Other Ambulatory Visit (HOSPITAL_COMMUNITY): Payer: Self-pay

## 2023-08-04 MED ORDER — VALACYCLOVIR HCL 1 G PO TABS
2000.0000 mg | ORAL_TABLET | Freq: Two times a day (BID) | ORAL | 1 refills | Status: DC
  Filled 2023-08-04: qty 20, 5d supply, fill #0

## 2023-08-06 ENCOUNTER — Other Ambulatory Visit (HOSPITAL_COMMUNITY): Payer: Self-pay

## 2023-10-03 ENCOUNTER — Other Ambulatory Visit (HOSPITAL_COMMUNITY): Payer: Self-pay

## 2023-10-03 ENCOUNTER — Other Ambulatory Visit: Payer: Self-pay

## 2023-10-29 DIAGNOSIS — Z803 Family history of malignant neoplasm of breast: Secondary | ICD-10-CM | POA: Diagnosis not present

## 2023-10-29 DIAGNOSIS — Z1239 Encounter for other screening for malignant neoplasm of breast: Secondary | ICD-10-CM | POA: Diagnosis not present

## 2023-10-29 DIAGNOSIS — G43909 Migraine, unspecified, not intractable, without status migrainosus: Secondary | ICD-10-CM | POA: Diagnosis not present

## 2023-10-29 DIAGNOSIS — Z0000 Encounter for general adult medical examination without abnormal findings: Secondary | ICD-10-CM | POA: Diagnosis not present

## 2023-10-29 DIAGNOSIS — E559 Vitamin D deficiency, unspecified: Secondary | ICD-10-CM | POA: Diagnosis not present

## 2023-10-29 DIAGNOSIS — K5790 Diverticulosis of intestine, part unspecified, without perforation or abscess without bleeding: Secondary | ICD-10-CM | POA: Diagnosis not present

## 2023-12-05 ENCOUNTER — Other Ambulatory Visit (HOSPITAL_COMMUNITY): Payer: Self-pay

## 2023-12-06 ENCOUNTER — Other Ambulatory Visit (HOSPITAL_COMMUNITY): Payer: Self-pay

## 2023-12-06 MED ORDER — AMOXICILLIN 500 MG PO TABS: 500.0000 mg | ORAL_TABLET | Freq: Two times a day (BID) | ORAL | 0 refills | Status: DC

## 2023-12-06 MED ORDER — AMOXICILLIN-POT CLAVULANATE 875-125 MG PO TABS
1.0000 | ORAL_TABLET | Freq: Two times a day (BID) | ORAL | 0 refills | Status: DC
  Filled 2023-12-06: qty 14, 7d supply, fill #0

## 2023-12-31 ENCOUNTER — Other Ambulatory Visit: Payer: Self-pay | Admitting: Obstetrics and Gynecology

## 2023-12-31 DIAGNOSIS — N6320 Unspecified lump in the left breast, unspecified quadrant: Secondary | ICD-10-CM

## 2024-01-16 ENCOUNTER — Other Ambulatory Visit (HOSPITAL_COMMUNITY): Payer: Self-pay

## 2024-02-18 DIAGNOSIS — H9313 Tinnitus, bilateral: Secondary | ICD-10-CM | POA: Diagnosis not present

## 2024-02-18 DIAGNOSIS — H903 Sensorineural hearing loss, bilateral: Secondary | ICD-10-CM | POA: Diagnosis not present

## 2024-03-20 ENCOUNTER — Ambulatory Visit
Admission: RE | Admit: 2024-03-20 | Discharge: 2024-03-20 | Disposition: A | Source: Ambulatory Visit | Attending: Obstetrics and Gynecology | Admitting: Obstetrics and Gynecology

## 2024-03-20 ENCOUNTER — Other Ambulatory Visit: Payer: Self-pay | Admitting: Obstetrics and Gynecology

## 2024-03-20 ENCOUNTER — Ambulatory Visit
Admission: RE | Admit: 2024-03-20 | Discharge: 2024-03-20 | Disposition: A | Discharge status: Home / Self Care | Source: Ambulatory Visit | Attending: Obstetrics and Gynecology | Admitting: Obstetrics and Gynecology

## 2024-03-20 DIAGNOSIS — N6320 Unspecified lump in the left breast, unspecified quadrant: Secondary | ICD-10-CM

## 2024-03-20 DIAGNOSIS — N6325 Unspecified lump in the left breast, overlapping quadrants: Secondary | ICD-10-CM

## 2024-03-20 DIAGNOSIS — N6012 Diffuse cystic mastopathy of left breast: Secondary | ICD-10-CM | POA: Diagnosis not present

## 2024-03-20 DIAGNOSIS — R928 Other abnormal and inconclusive findings on diagnostic imaging of breast: Secondary | ICD-10-CM | POA: Diagnosis not present

## 2024-03-24 ENCOUNTER — Other Ambulatory Visit (HOSPITAL_COMMUNITY): Payer: Self-pay

## 2024-03-25 ENCOUNTER — Other Ambulatory Visit: Payer: Self-pay

## 2024-03-25 ENCOUNTER — Ambulatory Visit
Admission: RE | Admit: 2024-03-25 | Discharge: 2024-03-25 | Disposition: A | Discharge status: Home / Self Care | Source: Ambulatory Visit | Attending: Obstetrics and Gynecology | Admitting: Obstetrics and Gynecology

## 2024-03-25 ENCOUNTER — Ambulatory Visit
Admission: RE | Admit: 2024-03-25 | Discharge: 2024-03-25 | Disposition: A | Source: Ambulatory Visit | Attending: Obstetrics and Gynecology | Admitting: Obstetrics and Gynecology

## 2024-03-25 DIAGNOSIS — N6325 Unspecified lump in the left breast, overlapping quadrants: Secondary | ICD-10-CM

## 2024-03-25 DIAGNOSIS — D242 Benign neoplasm of left breast: Secondary | ICD-10-CM | POA: Diagnosis not present

## 2024-03-25 DIAGNOSIS — N6092 Unspecified benign mammary dysplasia of left breast: Secondary | ICD-10-CM | POA: Diagnosis not present

## 2024-03-25 HISTORY — PX: BREAST BIOPSY: SHX20

## 2024-03-26 LAB — SURGICAL PATHOLOGY

## 2024-04-15 ENCOUNTER — Other Ambulatory Visit: Payer: Self-pay | Admitting: General Surgery

## 2024-04-15 DIAGNOSIS — N6321 Unspecified lump in the left breast, upper outer quadrant: Secondary | ICD-10-CM

## 2024-04-15 DIAGNOSIS — D4862 Neoplasm of uncertain behavior of left breast: Secondary | ICD-10-CM | POA: Diagnosis not present

## 2024-04-15 NOTE — Progress Notes (Signed)
 REFERRING PHYSICIAN:  Okey Marjorie Mania, MD PROVIDER:  DONNICE CARLIN BURY, MD MRN: [id number] DOB: 15-Jan-1976 DATE OF ENCOUNTER: 04/15/2024 Subjective    Chief Complaint: New Consultation (Unspecified benign mammary dysplasis of nnspecified breast)   History of Present Illness: This is a 48 year old female who has no prior breast history or self.  She has a family history of breast cancer in her sister at age 46 who is a fraternal twin.  Her sister had a PAL B2 mutation.  She does not have the same mutation.  She also has a family history in her paternal grandmother breast cancer as well as a couple of first cousins on her maternal side and a maternal aunt who is also a breast cancer patient.  She has no mass or discharge.  She has had a mass in the left breast.  This would be in follow-up.  Her breast density is C.  The initial biopsy was fibrocystic change.  This had changed over time.  Mammogram and ultrasound showed that the clip was about 1.2 cm away but the mass now is 1.3 x 0.5 x 1.2 cm.  She underwent a repeat biopsy which shows this to be a fibroadenoma with focal atypical lobular hyperplasia.  She is here to discuss the options today.    Review of Systems: A complete review of systems was obtained from the patient.  I have reviewed this information and discussed as appropriate with the patient.  See HPI as well for other ROS.  Review of Systems  All other systems reviewed and are negative.    Medical History: Past Medical History:  Diagnosis Date  . Family history of breast cancer   . Gastritis     There is no problem list on file for this patient.   Past Surgical History:  Procedure Laterality Date  . INCISIONAL BIOPSY BREAST Left 07/17/2023  . INCISIONAL BIOPSY BREAST Left 03/25/2024  . Knee surgery Left      Allergies  Allergen Reactions  . Sulfa (Sulfonamide Antibiotics) Rash and Shortness Of Breath  . Penicillins Rash    Current Outpatient Medications  on File Prior to Visit  Medication Sig Dispense Refill  . levonorgestreL  (MIRENA  52 MG) IUD Insert 1 each into the uterus once (Patient not taking: Reported on 04/15/2024)    . pantoprazole (PROTONIX) 40 MG DR tablet Take 40 mg by mouth 2 (two) times daily (Patient not taking: Reported on 04/15/2024)     No current facility-administered medications on file prior to visit.    Family History  Problem Relation Age of Onset  . Breast cancer Sister   . Bladder Cancer Maternal Aunt   . Breast cancer Maternal Aunt   . Dementia Maternal Grandmother   . Breast cancer Paternal Grandmother   . Breast cancer Other   . Breast cancer Other   . Cancer Other   . Breast cancer Cousin   . Other (Leiomyosarcoma) Cousin      Social History   Tobacco Use  Smoking Status Never  Smokeless Tobacco Never     Social History   Socioeconomic History  . Marital status: Married  Tobacco Use  . Smoking status: Never  . Smokeless tobacco: Never  Substance and Sexual Activity  . Alcohol use: Never  . Drug use: Never  . Sexual activity: Defer   Social Drivers of Health   Housing Stability: Unknown (04/15/2024)   Housing Stability Vital Sign   . Homeless in the Last Year: No  Objective:   Vitals:   04/15/24 1402  BP: 109/71  Pulse: 101  Temp: 37.1 C (98.7 F)  TempSrc: Temporal  SpO2: 99%  Height: 163.8 cm (5' 4.5)  PainSc: 0-No pain    There is no height or weight on file to calculate BMI.  Physical Exam Vitals reviewed.  Constitutional:      Appearance: Normal appearance.  Chest:  Breasts:    Right: No inverted nipple, mass or nipple discharge.     Left: No inverted nipple, mass or nipple discharge.  Lymphadenopathy:     Upper Body:     Right upper body: No supraclavicular or axillary adenopathy.     Left upper body: No supraclavicular or axillary adenopathy.  Neurological:     Mental Status: She is alert.        Assessment and Plan:     Diagnoses and all orders  for this visit:  Mass of upper outer quadrant of left breast    Left breast seed guided excisional biopsy  We discussed her risk.  We discussed the options of observation of this area versus excision.  Due to her family history and her elevated risk already as well as the presence of a mass and this focal atypical lobular hyperplasia we both decided that it would be better to excise this.  We discussed a seed guided excisional biopsy with the risks and benefits associated with that.  We also discussed that at minimum atypical lobular hyperplasia puts her at high risk.  We briefly discussed chemoprevention and how to follow her further.  We will await the final pathology from surgery before deciding exactly how to proceed.    MATTHEW CARLIN BURY, MD

## 2024-04-21 ENCOUNTER — Other Ambulatory Visit: Payer: Self-pay | Admitting: General Surgery

## 2024-04-21 DIAGNOSIS — N6321 Unspecified lump in the left breast, upper outer quadrant: Secondary | ICD-10-CM

## 2024-04-23 DIAGNOSIS — Z01419 Encounter for gynecological examination (general) (routine) without abnormal findings: Secondary | ICD-10-CM | POA: Diagnosis not present

## 2024-04-23 DIAGNOSIS — Z975 Presence of (intrauterine) contraceptive device: Secondary | ICD-10-CM | POA: Diagnosis not present

## 2024-04-25 NOTE — Progress Notes (Signed)
 Surgical Instructions   Your procedure is scheduled on Wednesday, December 10th, 2025. Report to [facility] Main Entrance A at 7:45 A.M., then check in with the Admitting office. Any questions or running late day of surgery: call [phone]  Questions prior to your surgery date: call [phone], Monday-Friday, 8am-4pm. If you experience any cold or flu symptoms such as cough, fever, chills, shortness of breath, etc. between now and your scheduled surgery, please notify us  at the above number.     Remember:  Do not eat after midnight the night before your surgery   You may drink clear liquids until 6:45 the morning of your surgery.   Clear liquids allowed are: Water, Non-Citrus Juices (without pulp), Carbonated Beverages, Clear Tea (no milk, honey, etc.), Black Coffee Only (NO MILK, CREAM OR POWDERED CREAMER of any kind), and Gatorade.  Patient Instructions  The night before surgery:  No food after midnight. ONLY clear liquids after midnight  The day of surgery (if you do NOT have diabetes):  Drink ONE (1) Pre-Surgery Clear Ensure by 6:45 the morning of surgery. Drink in one sitting. Do not sip.  This drink was given to you during your hospital  pre-op appointment visit.  Nothing else to drink after completing the  Pre-Surgery Clear Ensure.          If you have questions, please contact your surgeon's office.     Take these medicines the morning of surgery with A SIP OF WATER: None   May take these medicines IF NEEDED: None.     One week prior to surgery, STOP taking any Aspirin (unless otherwise instructed by your surgeon) Aleve, Naproxen, Ibuprofen, Motrin, Advil, Goody's, BC's, all herbal medications, fish oil, and non-prescription vitamins.                     Do NOT Smoke (Tobacco/Vaping) for 24 hours prior to your procedure.  If you use a CPAP at night, you may bring your mask/headgear for your overnight stay.   You will be asked to remove any contacts,  glasses, piercing's, hearing aid's, dentures/partials prior to surgery. Please bring cases for these items if needed.    Patients discharged the day of surgery will not be allowed to drive home, and someone needs to stay with them for 24 hours.  SURGICAL WAITING ROOM VISITATION Patients may have no more than 2 support people in the waiting area - these visitors may rotate.   Pre-op nurse will coordinate an appropriate time for 1 ADULT support person, who may not rotate, to accompany patient in pre-op.  Children under the age of 82 must have an adult with them who is not the patient and must remain in the main waiting area with an adult.  If the patient needs to stay at the hospital during part of their recovery, the visitor guidelines for inpatient rooms apply.  Please refer to the [facility] website for the visitor guidelines for any additional information.   If you received a COVID test during your pre-op visit  it is requested that you wear a mask when out in public, stay away from anyone that may not be feeling well and notify your surgeon if you develop symptoms. If you have been in contact with anyone that has tested positive in the last 10 days please notify you surgeon.      Pre-operative CHG Bathing Instructions   You can play a key role in reducing the risk of infection after surgery.  Your skin needs to be as free of germs as possible. You can reduce the number of germs on your skin by washing with CHG (chlorhexidine gluconate) soap before surgery. CHG is an antiseptic soap that kills germs and continues to kill germs even after washing.   DO NOT use if you have an allergy to chlorhexidine/CHG or antibacterial soaps. If your skin becomes reddened or irritated, stop using the CHG and notify one of our RNs at [phone].              TAKE A SHOWER THE NIGHT BEFORE SURGERY   Please keep in mind the following:  DO NOT shave, including legs and underarms, 48 hours prior to  surgery.   You may shave your face before/day of surgery.  Place clean sheets on your bed the night before surgery Use a clean washcloth (not used since being washed) for shower. DO NOT sleep with pet's night before surgery.  CHG Shower Instructions:  Wash your face and private area with normal soap. If you choose to wash your hair, wash first with your normal shampoo.  After you use shampoo/soap, rinse your hair and body thoroughly to remove shampoo/soap residue.  Turn the water OFF and apply half the bottle of CHG soap to a CLEAN washcloth.  Apply CHG soap ONLY FROM YOUR NECK DOWN TO YOUR TOES (washing for 3-5 minutes)  DO NOT use CHG soap on face, private areas, open wounds, or sores.  Pay special attention to the area where your surgery is being performed.  If you are having back surgery, having someone wash your back for you may be helpful. Wait 2 minutes after CHG soap is applied, then you may rinse off the CHG soap.  Pat dry with a clean towel  Put on clean pajamas    Additional instructions for the day of surgery: If you choose, you may shower the morning of surgery with an antibacterial soap.  DO NOT APPLY any lotions, deodorants, cologne, or perfumes.   Do not wear jewelry or makeup Do not wear nail polish, gel polish, artificial nails, or any other type of covering on natural nails (fingers and toes) Do not bring valuables to the hospital. [facility] is not responsible for valuables/personal belongings. Put on clean/comfortable clothes.  Please brush your teeth.  Ask your nurse before applying any prescription medications to the skin.

## 2024-04-28 ENCOUNTER — Other Ambulatory Visit: Payer: Self-pay

## 2024-04-28 ENCOUNTER — Encounter (HOSPITAL_COMMUNITY): Payer: Self-pay

## 2024-04-28 ENCOUNTER — Encounter (HOSPITAL_COMMUNITY)
Admission: RE | Admit: 2024-04-28 | Discharge: 2024-04-28 | Disposition: A | Discharge status: Home / Self Care | Source: Ambulatory Visit | Attending: General Surgery | Admitting: General Surgery

## 2024-04-28 ENCOUNTER — Other Ambulatory Visit: Payer: Self-pay | Admitting: General Surgery

## 2024-04-28 VITALS — BP 114/79 | HR 63 | Temp 97.9°F | Resp 18 | Ht 65.0 in | Wt 119.0 lb

## 2024-04-28 DIAGNOSIS — Z01818 Encounter for other preprocedural examination: Secondary | ICD-10-CM

## 2024-04-28 DIAGNOSIS — N6321 Unspecified lump in the left breast, upper outer quadrant: Secondary | ICD-10-CM

## 2024-04-28 LAB — CBC
HCT: 43.2 % (ref 36.0–46.0)
Hemoglobin: 14.2 g/dL (ref 12.0–15.0)
MCH: 30.6 pg (ref 26.0–34.0)
MCHC: 32.9 g/dL (ref 30.0–36.0)
MCV: 93.1 fL (ref 80.0–100.0)
Platelets: 234 K/uL (ref 150–400)
RBC: 4.64 MIL/uL (ref 3.87–5.11)
RDW: 12.6 % (ref 11.5–15.5)
WBC: 5.7 K/uL (ref 4.0–10.5)
nRBC: 0 % (ref 0.0–0.2)

## 2024-04-28 LAB — POCT PREGNANCY, URINE: Preg Test, Ur: NEGATIVE

## 2024-04-28 NOTE — Progress Notes (Signed)
 PCP - [facility] -   PPM/ICD - denies Device Orders - na Rep Notified - na  Chest x-ray - na EKG - na Stress Test -  ECHO -  Cardiac Cath -   Sleep Study - denies CPAP - na  Non-diabetic  Blood Thinner Instructions: denies Aspirin Instructions:denies  ERAS Protcol - Ensure until 9354  Anesthesia review: Yes. Seed   Patient denies shortness of breath, fever, cough and chest pain at PAT appointment   All instructions explained to the patient, with a verbal understanding of the material. Patient agrees to go over the instructions while at home for a better understanding. Patient also instructed to self quarantine after being tested for COVID-19. The opportunity to ask questions was provided.

## 2024-04-29 ENCOUNTER — Inpatient Hospital Stay: Admission: RE | Admit: 2024-04-29 | Discharge: 2024-04-29 | Attending: General Surgery | Admitting: General Surgery

## 2024-04-29 ENCOUNTER — Ambulatory Visit
Admission: RE | Admit: 2024-04-29 | Discharge: 2024-04-29 | Disposition: A | Source: Ambulatory Visit | Attending: General Surgery | Admitting: General Surgery

## 2024-04-29 DIAGNOSIS — N6321 Unspecified lump in the left breast, upper outer quadrant: Secondary | ICD-10-CM

## 2024-04-29 DIAGNOSIS — D242 Benign neoplasm of left breast: Secondary | ICD-10-CM | POA: Diagnosis not present

## 2024-04-29 DIAGNOSIS — N6325 Unspecified lump in the left breast, overlapping quadrants: Secondary | ICD-10-CM | POA: Diagnosis not present

## 2024-04-29 HISTORY — PX: BREAST BIOPSY: SHX20

## 2024-04-29 NOTE — H&P (Signed)
 48 year old female who has no prior breast history or self. She has a family history of breast cancer in her sister at age 78 who is a fraternal twin. Her sister had a PALB2 mutation. She does not have the same mutation. She also has a family history in her paternal grandmother breast cancer as well as a couple of first cousins on her maternal side and a maternal aunt who is also a breast cancer patient. She has no mass or discharge. She has had a mass in the left breast. This would be in follow-up. Her breast density is C. The initial biopsy was fibrocystic change. This had changed over time. Mammogram and ultrasound showed that the clip was about 1.2 cm away but the mass now is 1.3 x 0.5 x 1.2 cm. She underwent a repeat biopsy which shows this to be a fibroadenoma with focal atypical lobular hyperplasia. She is here to discuss the options today.  Review of Systems: A complete review of systems was obtained from the patient. I have reviewed this information and discussed as appropriate with the patient. See HPI as well for other ROS.  Review of Systems  All other systems reviewed and are negative.   Medical History: Past Medical History:  Diagnosis Date  Family history of breast cancer  Gastritis    Past Surgical History:  Procedure Laterality Date  INCISIONAL BIOPSY BREAST Left 07/17/2023  INCISIONAL BIOPSY BREAST Left 03/25/2024  Knee surgery Left    Allergies  Allergen Reactions  Sulfa (Sulfonamide Antibiotics) Rash and Shortness Of Breath  Penicillins Rash   Current Outpatient Medications on File Prior to Visit  Medication Sig Dispense Refill  levonorgestreL  (MIRENA  52 MG) IUD Insert 1 each into the uterus once (Patient not taking: Reported on 04/15/2024)  pantoprazole (PROTONIX) 40 MG DR tablet Take 40 mg by mouth 2 (two) times daily (Patient not taking: Reported on 04/15/2024)    Family History  Problem Relation Age of Onset  Breast cancer Sister  Bladder Cancer Maternal  Aunt  Breast cancer Maternal Aunt  Dementia Maternal Grandmother  Breast cancer Paternal Grandmother  Breast cancer Other  Breast cancer Other  Cancer Other  Breast cancer Cousin  Other (Leiomyosarcoma) Cousin    Social History   Tobacco Use  Smoking Status Never  Smokeless Tobacco Never  Marital status: Married  Tobacco Use  Smoking status: Never  Smokeless tobacco: Never  Substance and Sexual Activity  Alcohol use: Never  Drug use: Never  Sexual activity: Defer    Objective:   Vitals:  04/15/24 1402  BP: 109/71  Pulse: 101  Temp: 37.1 C (98.7 F)  TempSrc: Temporal  SpO2: 99%  Height: 163.8 cm (5' 4.5)    Physical Exam Vitals reviewed.  Constitutional:  Appearance: Normal appearance.  Chest:  Breasts: Right: No inverted nipple, mass or nipple discharge.  Left: No inverted nipple, mass or nipple discharge.  Lymphadenopathy:  Upper Body:  Right upper body: No supraclavicular or axillary adenopathy.  Left upper body: No supraclavicular or axillary adenopathy.  Neurological:  Mental Status: She is alert.    Assessment and Plan:   Mass of upper outer quadrant of left breast  Left breast seed guided excisional biopsy  We discussed her risk. We discussed the options of observation of this area versus excision. Due to her family history and her elevated risk already as well as the presence of a mass and this focal atypical lobular hyperplasia we both decided that it would be better to excise  this. We discussed a seed guided excisional biopsy with the risks and benefits associated with that.  We also discussed that at minimum atypical lobular hyperplasia puts her at high risk. We briefly discussed chemoprevention and how to follow her further. We will await the final pathology from surgery before deciding exactly how to proceed.

## 2024-04-30 ENCOUNTER — Encounter (HOSPITAL_COMMUNITY): Payer: Self-pay | Admitting: General Surgery

## 2024-04-30 ENCOUNTER — Inpatient Hospital Stay: Admission: RE | Admit: 2024-04-30 | Discharge: 2024-04-30 | Attending: General Surgery | Admitting: General Surgery

## 2024-04-30 ENCOUNTER — Encounter: Admission: RE | Disposition: A | Payer: Self-pay | Attending: General Surgery

## 2024-04-30 ENCOUNTER — Ambulatory Visit (HOSPITAL_COMMUNITY): Payer: Self-pay | Admitting: Physician Assistant

## 2024-04-30 ENCOUNTER — Other Ambulatory Visit: Payer: Self-pay

## 2024-04-30 ENCOUNTER — Ambulatory Visit (HOSPITAL_COMMUNITY)
Admission: RE | Admit: 2024-04-30 | Discharge: 2024-04-30 | Disposition: A | Attending: General Surgery | Admitting: General Surgery

## 2024-04-30 ENCOUNTER — Ambulatory Visit (HOSPITAL_COMMUNITY): Payer: Self-pay | Admitting: Anesthesiology

## 2024-04-30 DIAGNOSIS — N6082 Other benign mammary dysplasias of left breast: Secondary | ICD-10-CM | POA: Diagnosis not present

## 2024-04-30 DIAGNOSIS — N6321 Unspecified lump in the left breast, upper outer quadrant: Secondary | ICD-10-CM

## 2024-04-30 DIAGNOSIS — Z803 Family history of malignant neoplasm of breast: Secondary | ICD-10-CM | POA: Diagnosis not present

## 2024-04-30 DIAGNOSIS — D242 Benign neoplasm of left breast: Secondary | ICD-10-CM | POA: Diagnosis not present

## 2024-04-30 DIAGNOSIS — N6092 Unspecified benign mammary dysplasia of left breast: Secondary | ICD-10-CM | POA: Diagnosis not present

## 2024-04-30 DIAGNOSIS — N6320 Unspecified lump in the left breast, unspecified quadrant: Secondary | ICD-10-CM | POA: Diagnosis not present

## 2024-04-30 DIAGNOSIS — N6012 Diffuse cystic mastopathy of left breast: Secondary | ICD-10-CM | POA: Diagnosis not present

## 2024-04-30 HISTORY — PX: RADIOACTIVE SEED GUIDED BREAST BIOPSY: SHX7688

## 2024-04-30 SURGERY — RADIOACTIVE SEED GUIDED BREAST BIOPSY
Anesthesia: General | Site: Breast | Laterality: Left

## 2024-04-30 MED ORDER — DEXAMETHASONE SOD PHOSPHATE PF 10 MG/ML IJ SOLN
INTRAMUSCULAR | Status: DC | PRN
  Administered 2024-04-30: 10 mg via INTRAVENOUS

## 2024-04-30 MED ORDER — PHENYLEPHRINE 80 MCG/ML (10ML) SYRINGE FOR IV PUSH (FOR BLOOD PRESSURE SUPPORT)
PREFILLED_SYRINGE | INTRAVENOUS | Status: DC | PRN
  Administered 2024-04-30 (×3): 160 ug via INTRAVENOUS
  Administered 2024-04-30: 120 ug via INTRAVENOUS

## 2024-04-30 MED ORDER — ONDANSETRON HCL 4 MG/2ML IJ SOLN: 4.0000 mg | Freq: Once | INTRAMUSCULAR | Status: DC | PRN

## 2024-04-30 MED ORDER — ONDANSETRON HCL 4 MG/2ML IJ SOLN
INTRAMUSCULAR | Status: AC
  Filled 2024-04-30: qty 2

## 2024-04-30 MED ORDER — SUGAMMADEX SODIUM 200 MG/2ML IV SOLN
INTRAVENOUS | Status: AC
  Filled 2024-04-30: qty 2

## 2024-04-30 MED ORDER — GLYCOPYRROLATE 0.2 MG/ML IJ SOLN
INTRAMUSCULAR | Status: DC | PRN
  Administered 2024-04-30: .2 mg via INTRAVENOUS

## 2024-04-30 MED ORDER — FENTANYL CITRATE (PF) 250 MCG/5ML IJ SOLN
INTRAMUSCULAR | Status: AC
  Filled 2024-04-30: qty 5

## 2024-04-30 MED ORDER — LIDOCAINE 2% (20 MG/ML) 5 ML SYRINGE
INTRAMUSCULAR | Status: DC | PRN
  Administered 2024-04-30: 60 mg via INTRAVENOUS

## 2024-04-30 MED ORDER — ACETAMINOPHEN 500 MG PO TABS
1000.0000 mg | ORAL_TABLET | ORAL | Status: AC
  Administered 2024-04-30: 1000 mg via ORAL
  Filled 2024-04-30: qty 2

## 2024-04-30 MED ORDER — ORAL CARE MOUTH RINSE: 15.0000 mL | Freq: Once | OROMUCOSAL | Status: AC

## 2024-04-30 MED ORDER — CHLORHEXIDINE GLUCONATE CLOTH 2 % EX PADS: 6.0000 | MEDICATED_PAD | Freq: Once | CUTANEOUS | Status: DC

## 2024-04-30 MED ORDER — MIDAZOLAM HCL (PF) 2 MG/2ML IJ SOLN
INTRAMUSCULAR | Status: DC | PRN
  Administered 2024-04-30: 2 mg via INTRAVENOUS

## 2024-04-30 MED ORDER — PROPOFOL 10 MG/ML IV BOLUS
INTRAVENOUS | Status: AC
  Filled 2024-04-30: qty 20

## 2024-04-30 MED ORDER — BUPIVACAINE-EPINEPHRINE 0.25% -1:200000 IJ SOLN
INTRAMUSCULAR | Status: DC | PRN
  Administered 2024-04-30: 10 mL

## 2024-04-30 MED ORDER — LACTATED RINGERS IV SOLN: INTRAVENOUS | Status: DC

## 2024-04-30 MED ORDER — KETOROLAC TROMETHAMINE 15 MG/ML IJ SOLN
15.0000 mg | INTRAMUSCULAR | Status: AC
  Administered 2024-04-30: 15 mg via INTRAVENOUS
  Filled 2024-04-30: qty 1

## 2024-04-30 MED ORDER — 0.9 % SODIUM CHLORIDE (POUR BTL) OPTIME
TOPICAL | Status: DC | PRN
  Administered 2024-04-30: 1000 mL

## 2024-04-30 MED ORDER — ENSURE PRE-SURGERY PO LIQD: 296.0000 mL | Freq: Once | ORAL | Status: DC

## 2024-04-30 MED ORDER — EPHEDRINE SULFATE-NACL 50-0.9 MG/10ML-% IV SOSY
PREFILLED_SYRINGE | INTRAVENOUS | Status: DC | PRN
  Administered 2024-04-30: 7.5 mg via INTRAVENOUS

## 2024-04-30 MED ORDER — BUPIVACAINE-EPINEPHRINE (PF) 0.25% -1:200000 IJ SOLN
INTRAMUSCULAR | Status: AC
  Filled 2024-04-30: qty 30

## 2024-04-30 MED ORDER — AMISULPRIDE (ANTIEMETIC) 5 MG/2ML IV SOLN: 10.0000 mg | Freq: Once | INTRAVENOUS | Status: DC | PRN

## 2024-04-30 MED ORDER — SCOPOLAMINE 1 MG/3DAYS TD PT72
1.0000 | MEDICATED_PATCH | TRANSDERMAL | Status: DC
  Administered 2024-04-30: 1 mg via TRANSDERMAL
  Filled 2024-04-30: qty 1

## 2024-04-30 MED ORDER — MIDAZOLAM HCL 2 MG/2ML IJ SOLN
INTRAMUSCULAR | Status: AC
  Filled 2024-04-30: qty 2

## 2024-04-30 MED ORDER — FENTANYL CITRATE (PF) 250 MCG/5ML IJ SOLN
INTRAMUSCULAR | Status: DC | PRN
  Administered 2024-04-30: 50 ug via INTRAVENOUS

## 2024-04-30 MED ORDER — PROPOFOL 10 MG/ML IV BOLUS
INTRAVENOUS | Status: DC | PRN
  Administered 2024-04-30: 140 mg via INTRAVENOUS

## 2024-04-30 MED ORDER — LIDOCAINE 2% (20 MG/ML) 5 ML SYRINGE
INTRAMUSCULAR | Status: AC
  Filled 2024-04-30: qty 5

## 2024-04-30 MED ORDER — CEFAZOLIN SODIUM-DEXTROSE 2-4 GM/100ML-% IV SOLN
2.0000 g | INTRAVENOUS | Status: AC
  Administered 2024-04-30: 2 g via INTRAVENOUS
  Filled 2024-04-30: qty 100

## 2024-04-30 MED ORDER — ROCURONIUM BROMIDE 10 MG/ML (PF) SYRINGE
PREFILLED_SYRINGE | INTRAVENOUS | Status: AC
  Filled 2024-04-30: qty 10

## 2024-04-30 MED ORDER — ONDANSETRON HCL 4 MG/2ML IJ SOLN
INTRAMUSCULAR | Status: DC | PRN
  Administered 2024-04-30: 4 mg via INTRAVENOUS

## 2024-04-30 MED ORDER — CHLORHEXIDINE GLUCONATE 0.12 % MT SOLN
15.0000 mL | Freq: Once | OROMUCOSAL | Status: AC
  Administered 2024-04-30: 15 mL via OROMUCOSAL
  Filled 2024-04-30: qty 15

## 2024-04-30 MED ORDER — FENTANYL CITRATE (PF) 100 MCG/2ML IJ SOLN: 25.0000 ug | INTRAMUSCULAR | Status: DC | PRN

## 2024-04-30 MED ORDER — PHENYLEPHRINE HCL-NACL 20-0.9 MG/250ML-% IV SOLN
INTRAVENOUS | Status: DC | PRN
  Administered 2024-04-30: 20 ug/min via INTRAVENOUS

## 2024-04-30 SURGICAL SUPPLY — 24 items
CANISTER SUCTION 3000ML PPV (SUCTIONS) ×1 IMPLANT
CHLORAPREP W/TINT 26 (MISCELLANEOUS) ×1 IMPLANT
COVER PROBE W GEL 5X96 (DRAPES) ×1 IMPLANT
COVER SURGICAL LIGHT HANDLE (MISCELLANEOUS) ×1 IMPLANT
DERMABOND ADVANCED .7 DNX12 (GAUZE/BANDAGES/DRESSINGS) ×1 IMPLANT
DEVICE DUBIN SPECIMEN MAMMOGRA (MISCELLANEOUS) ×1 IMPLANT
DRAPE CHEST BREAST 15X10 FENES (DRAPES) ×1 IMPLANT
ELECT COATED BLADE 2.86 ST (ELECTRODE) ×1 IMPLANT
ELECTRODE REM PT RTRN 9FT ADLT (ELECTROSURGICAL) ×1 IMPLANT
GLOVE BIO SURGEON STRL SZ7 (GLOVE) ×2 IMPLANT
GLOVE BIOGEL PI IND STRL 7.5 (GLOVE) ×1 IMPLANT
GOWN STRL REUS W/ TWL LRG LVL3 (GOWN DISPOSABLE) ×2 IMPLANT
KIT BASIN OR (CUSTOM PROCEDURE TRAY) ×1 IMPLANT
KIT MARKER MARGIN INK (KITS) ×1 IMPLANT
NDL HYPO 25GX1X1/2 BEV (NEEDLE) ×1 IMPLANT
PACK GENERAL/GYN (CUSTOM PROCEDURE TRAY) ×1 IMPLANT
SOLN 0.9% NACL POUR BTL 1000ML (IV SOLUTION) ×1 IMPLANT
STRIP CLOSURE SKIN 1/2X4 (GAUZE/BANDAGES/DRESSINGS) ×1 IMPLANT
SUT MNCRL AB 4-0 PS2 18 (SUTURE) ×1 IMPLANT
SUT MON AB 5-0 PS2 18 (SUTURE) IMPLANT
SUT VIC AB 2-0 SH 27XBRD (SUTURE) ×1 IMPLANT
SUT VIC AB 3-0 SH 27X BRD (SUTURE) ×1 IMPLANT
SYR CONTROL 10ML LL (SYRINGE) ×1 IMPLANT
TOWEL GREEN STERILE (TOWEL DISPOSABLE) ×1 IMPLANT

## 2024-04-30 NOTE — Anesthesia Procedure Notes (Signed)
 Procedure Name: LMA Insertion Date/Time: 04/30/2024 10:23 AM  Performed by: Elby Raelene SAUNDERS, CRNAPre-anesthesia Checklist: Patient identified, Emergency Drugs available, Suction available and Patient being monitored Patient Re-evaluated:Patient Re-evaluated prior to induction Oxygen Delivery Method: Circle System Utilized Preoxygenation: Pre-oxygenation with 100% oxygen Induction Type: IV induction Ventilation: Mask ventilation without difficulty LMA: LMA inserted LMA Size: 3.0 Number of attempts: 1 Airway Equipment and Method: Bite block Placement Confirmation: positive ETCO2 Tube secured with: Tape Dental Injury: Teeth and Oropharynx as per pre-operative assessment

## 2024-04-30 NOTE — Op Note (Signed)
 Preoperative diagnosis: Left breast mass with core biopsy showing focal atypical lobular hyperplasia, family history of breast cancer Postoperative diagnosis: Same as above Procedure: Left breast radioactive seed guided excisional biopsy Surgeon: Dr. 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  She does not have the same mutation.  She had a mass found on mammographic screening.  This is changed over time.  Mammogram and ultrasound showed that this is about a 1.3 cm mass with the biopsy showing this to be a fibroadenoma with focal atypical lobular hyperplasia.  We discussed her options and elected to proceed with seed guided excisional biopsy.  Procedure: After informed consent was obtained she was taken to the operative room.  She had a seed placed prior to beginning.  I had these mammograms in the operating room.  She was given antibiotics.  SCDs were placed.  She was placed under general anesthesia without complication.  She was prepped and draped in a standard sterile surgical fashion.  A surgical timeout was then performed.  I located the seed in the left lateral breast.  I infiltrated Marcaine  throughout this area.  I then made a periareolar incision in order at the scar later.  I dissected to the seed using the neoprobe for guidance.  I then remove the seed and some of the surrounding tissue.  Mammogram confirmed removal of the seed and the clip.  I then obtained hemostasis.  I closed the breast tissue with 2-0 Vicryl.  The skin was closed with 3-0 Vicryl and 5-0 Monocryl.  Glue and Steri-Strips were applied.  She tolerated this well was extubated transferred recovery stable.

## 2024-04-30 NOTE — Anesthesia Postprocedure Evaluation (Signed)
 Anesthesia Post Note  Patient: Nicole Dixon  Procedure(s) Performed: RADIOACTIVE SEED GUIDED BREAST BIOPSY (Left: Breast)     Patient location during evaluation: PACU Anesthesia Type: General Level of consciousness: awake and alert Pain management: pain level controlled Vital Signs Assessment: post-procedure vital signs reviewed and stable Respiratory status: spontaneous breathing, nonlabored ventilation and respiratory function stable Cardiovascular status: blood pressure returned to baseline and stable Postop Assessment: no apparent nausea or vomiting Anesthetic complications: no   No notable events documented.  Last Vitals:  Vitals:   04/30/24 1130 04/30/24 1145  BP: 95/65 97/66  Pulse: 73 65  Resp: 19 14  Temp:  36.6 C  SpO2: 100% 100%    Last Pain:  Vitals:   04/30/24 1145  TempSrc:   PainSc: 2                  Garnette FORBES Skillern

## 2024-04-30 NOTE — Anesthesia Preprocedure Evaluation (Signed)
 Anesthesia Evaluation  Patient identified by MRN, date of birth, ID band Patient awake    Reviewed: Allergy & Precautions, NPO status , Patient's Chart, lab work & pertinent test results  Airway Mallampati: II  TM Distance: >3 FB Neck ROM: Full    Dental  (+) Teeth Intact, Dental Advisory Given   Pulmonary neg pulmonary ROS   Pulmonary exam normal breath sounds clear to auscultation       Cardiovascular negative cardio ROS Normal cardiovascular exam Rhythm:Regular Rate:Normal     Neuro/Psych negative neurological ROS     GI/Hepatic negative GI ROS, Neg liver ROS,,,Gastritis    Endo/Other  negative endocrine ROS    Renal/GU negative Renal ROS     Musculoskeletal negative musculoskeletal ROS (+)    Abdominal   Peds  Hematology negative hematology ROS (+)   Anesthesia Other Findings Day of surgery medications reviewed with the patient.  LEFT BREAST MASS  Reproductive/Obstetrics                              Anesthesia Physical Anesthesia Plan  ASA: 1  Anesthesia Plan: General   Post-op Pain Management: Tylenol  PO (pre-op)*   Induction: Intravenous  PONV Risk Score and Plan: 3 and Midazolam , Dexamethasone and Ondansetron   Airway Management Planned: LMA  Additional Equipment:   Intra-op Plan:   Post-operative Plan: Extubation in OR  Informed Consent: I have reviewed the patients History and Physical, chart, labs and discussed the procedure including the risks, benefits and alternatives for the proposed anesthesia with the patient or authorized representative who has indicated his/her understanding and acceptance.     Dental advisory given  Plan Discussed with: CRNA  Anesthesia Plan Comments:         Anesthesia Quick Evaluation

## 2024-04-30 NOTE — Interval H&P Note (Signed)
 History and Physical Interval Note:  04/30/2024 9:03 AM  Nicole Dixon  has presented today for surgery, with the diagnosis of LEFT BREAST MASS.  The various methods of treatment have been discussed with the patient and family. After consideration of risks, benefits and other options for treatment, the patient has consented to  Procedure(s) with comments: RADIOACTIVE SEED GUIDED BREAST BIOPSY (Left) - LEFT BREAST SEED GUIDED EXCISIONAL BIOPSY as a surgical intervention.  The patient's history has been reviewed, patient examined, no change in status, stable for surgery.  I have reviewed the patient's chart and labs.  Questions were answered to the patient's satisfaction.     Donnice Bury

## 2024-04-30 NOTE — Transfer of Care (Signed)
 Immediate Anesthesia Transfer of Care Note  Patient: Nicole Dixon  Procedure(s) Performed: RADIOACTIVE SEED GUIDED BREAST BIOPSY (Left: Breast)  Patient Location: PACU  Anesthesia Type:General  Level of Consciousness: awake, alert , and oriented  Airway & Oxygen Therapy: Patient Spontanous Breathing  Post-op Assessment: Report given to RN and Post -op Vital signs reviewed and stable  Post vital signs: Reviewed and stable  Last Vitals:  Vitals Value Taken Time  BP 97/63 04/30/24 11:07  Temp 36.5 C 04/30/24 11:07  Pulse 79 04/30/24 11:08  Resp 18 04/30/24 11:08  SpO2 100 % 04/30/24 11:08  Vitals shown include unfiled device data.  Last Pain:  Vitals:   04/30/24 0917  TempSrc:   PainSc: 0-No pain         Complications: No notable events documented.

## 2024-04-30 NOTE — Discharge Instructions (Signed)
 [facility] Office Phone Number [phone]  POST OP INSTRUCTIONS Take 400 mg of ibuprofen every 8 hours or 650 mg tylenol  every 6 hours for next 72 hours then as needed. Use ice several times daily also.  A prescription for pain medication may be given to you upon discharge.  Take your pain medication as prescribed, if needed.  If narcotic pain medicine is not needed, then you may take acetaminophen  (Tylenol ), naprosyn (Alleve) or ibuprofen (Advil) as needed. Take your usually prescribed medications unless otherwise directed If you need a refill on your pain medication, please contact your pharmacy.  They will contact our office to request authorization.  Prescriptions will not be filled after 5pm or on week-ends. You should eat very light the first 24 hours after surgery, such as soup, crackers, pudding, etc.  Resume your normal diet the day after surgery. Most patients will experience some swelling and bruising in the breast.  Ice packs and a good support bra will help.  Wear the breast binder provided or a sports bra for 72 hours day and night.  After that wear a sports bra during the day until you return to the office. Swelling and bruising can take several days to resolve.  It is common to experience some constipation if taking pain medication after surgery.  Increasing fluid intake and taking a stool softener will usually help or prevent this problem from occurring.  A mild laxative (Milk of Magnesia or Miralax) should be taken according to package directions if there are no bowel movements after 48 hours. I used skin glue on the incision, you may shower in 24 hours.  The glue will flake off over the next 2-3 weeks as will the steristrips.   Any sutures or staples will be removed at the office during your follow-up visit. ACTIVITIES:  You may resume regular daily activities (gradually increasing) beginning the next day.  Wearing a good support bra or sports bra minimizes pain and  swelling.  You may have sexual intercourse when it is comfortable. You may drive when you no longer are taking prescription pain medication, you can comfortably wear a seatbelt, and you can safely maneuver your car and apply brakes. RETURN TO WORK:  ______________________________________________________________________________________ Nicole Dixon should see your doctor in the office for a follow-up appointment approximately two to three weeks after your surgery.  Your doctor's nurse will typically make your follow-up appointment when she calls you with your pathology report.  Expect your pathology report 3-4 business days after your surgery.  You may call to check if you do not hear from us  after three days.  WHEN TO CALL DR Nicole Dixon: Fever over 101.0 Nausea and/or vomiting. Extreme swelling or bruising. Continued bleeding from incision. Increased pain, redness, or drainage from the incision.  The clinic staff is available to answer your questions during regular business hours.  Please don't hesitate to call and ask to speak to one of the nurses for clinical concerns.  If you have a medical emergency, go to the nearest emergency room or call 911.  A surgeon from [facility] is always on call at the hospital.  For further questions, please visit [email or web address]

## 2024-05-01 ENCOUNTER — Encounter (HOSPITAL_COMMUNITY): Payer: Self-pay | Admitting: General Surgery

## 2024-05-02 LAB — SURGICAL PATHOLOGY

## 2024-06-03 ENCOUNTER — Other Ambulatory Visit: Payer: Self-pay

## 2024-06-03 ENCOUNTER — Other Ambulatory Visit: Payer: Self-pay | Admitting: General Surgery

## 2024-06-03 DIAGNOSIS — Z803 Family history of malignant neoplasm of breast: Secondary | ICD-10-CM
